# Patient Record
Sex: Female | Born: 1955 | Race: White | Hispanic: No | Smoking: Current every day smoker
Health system: Southern US, Community
[De-identification: ages and names within clinical notes are randomized; demographics above are authoritative.]

## PROBLEM LIST (undated history)

## (undated) DIAGNOSIS — K8020 Calculus of gallbladder without cholecystitis without obstruction: Secondary | ICD-10-CM

## (undated) DIAGNOSIS — E785 Hyperlipidemia, unspecified: Secondary | ICD-10-CM

## (undated) DIAGNOSIS — K219 Gastro-esophageal reflux disease without esophagitis: Secondary | ICD-10-CM

## (undated) DIAGNOSIS — E0590 Thyrotoxicosis, unspecified without thyrotoxic crisis or storm: Secondary | ICD-10-CM

## (undated) DIAGNOSIS — N200 Calculus of kidney: Secondary | ICD-10-CM

## (undated) DIAGNOSIS — G4730 Sleep apnea, unspecified: Secondary | ICD-10-CM

## (undated) HISTORY — DX: Calculus of kidney: N20.0

## (undated) HISTORY — DX: Calculus of gallbladder without cholecystitis without obstruction: K80.20

## (undated) HISTORY — DX: Gastro-esophageal reflux disease without esophagitis: K21.9

## (undated) HISTORY — PX: NO PAST SURGERIES: SHX2092

## (undated) HISTORY — DX: Sleep apnea, unspecified: G47.30

## (undated) HISTORY — PX: MOUTH SURGERY: SHX715

## (undated) HISTORY — DX: Thyrotoxicosis, unspecified without thyrotoxic crisis or storm: E05.90

## (undated) HISTORY — PX: OTHER SURGICAL HISTORY: SHX169

## (undated) HISTORY — DX: Hyperlipidemia, unspecified: E78.5

## (undated) HISTORY — DX: Hemochromatosis, unspecified: E83.119

---

## 2003-02-26 DIAGNOSIS — E0590 Thyrotoxicosis, unspecified without thyrotoxic crisis or storm: Secondary | ICD-10-CM

## 2003-02-26 HISTORY — DX: Thyrotoxicosis, unspecified without thyrotoxic crisis or storm: E05.90

## 2008-02-26 HISTORY — PX: BREAST BIOPSY: SHX20

## 2011-05-21 ENCOUNTER — Ambulatory Visit (HOSPITAL_BASED_OUTPATIENT_CLINIC_OR_DEPARTMENT_OTHER): Payer: 59 | Admitting: Hematology & Oncology

## 2011-05-21 ENCOUNTER — Other Ambulatory Visit (HOSPITAL_BASED_OUTPATIENT_CLINIC_OR_DEPARTMENT_OTHER): Payer: 59 | Admitting: Lab

## 2011-05-21 ENCOUNTER — Ambulatory Visit: Payer: 59

## 2011-05-21 DIAGNOSIS — Z832 Family history of diseases of the blood and blood-forming organs and certain disorders involving the immune mechanism: Secondary | ICD-10-CM

## 2011-05-21 LAB — CBC WITH DIFFERENTIAL (CANCER CENTER ONLY)
BASO%: 0.9 % (ref 0.0–2.0)
HCT: 39.6 % (ref 34.8–46.6)
LYMPH%: 31.6 % (ref 14.0–48.0)
MCHC: 33.1 g/dL (ref 32.0–36.0)
MCV: 82 fL (ref 81–101)
MONO#: 0.5 10*3/uL (ref 0.1–0.9)
NEUT%: 59 % (ref 39.6–80.0)
RDW: 17.1 % — ABNORMAL HIGH (ref 11.1–15.7)

## 2011-05-21 NOTE — Progress Notes (Signed)
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  HISTORY OF PRESENT ILLNESS:  Diana Armstrong is a very charming 56 year old white female.  She actually is the sister of one of my other patients. There is an incredibly strong family history of hemochromatosis in the family.  I have taken care of the 3 or 4 of her family members.  Her father died unexpectedly but not from hemochromatosis.  We do have her sister on a phlebotomy program.  Ms. Carneiro does not live in town here.  She recently was in [place]. She has moved all over the country.  She and I certainly have a lot in common as we both have lived in [place] and [place].  She did have her DNA studies done for hemochromatosis back in 2010.  She had this done down in [place].  She was found to have 2 mutations of the C282Y mutation.  Again, this is what is in her family.  This is incredibly strong gene.  She had been on a phlebotomy program.  The last ferritin that I have on her was back in September 2012 and it was 91.  She said she was phlebotomized in October.  She did have iron studies done back in September 2012.  Iron saturation was only 6%.  She had an ultrasound done of the abdomen back in January 2011. Ultrasound was pretty unremarkable.  She has not had any kind of cardiac issues.  She has had no cough or shortness of breath.  Her last chest x-ray was done back in January 2011.  Chest x-ray shows some interstitial markings that were increased.  These appear to show underlying COPD.  She does smoke.  She apparently underwent a workup for polycythemia which I suspect was unremarkable.  [place]  Her sister was very kind in making a referral to the [facility] for evaluation.  She feels okay.  She is still working.  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  She has not had any weight loss or weight gain.  Appetite is good.   She has had no nausea or vomiting.  There have been no arthritic issues, particularly with her hands.  She has had no rashes.  She has had no nausea, vomiting.  PAST MEDICAL HISTORY: 1. Hemochromatosis. 2. Hypothyroidism. 3. GERD. 4. Hyperlipidemia.  ALLERGIES:  None.  MEDICATIONS:  Aspirin 81 mg p.o. daily, Nexium 40 mg p.o. daily, fenofibrate 54 mg p.o. daily, Synthroid 0.137 mg p.o. daily, Zocor 20 mg p.o. daily.  SOCIAL HISTORY:  Remarkable for 1 pack per day of tobacco use.  She has rare alcohol use.  She has no obvious occupational exposures.  FAMILY HISTORY:  Remarkable for hemochromatosis in her family.  She has, I think, 4 siblings who have it.  She has 2 daughters.  I suspect that each of these has to be a carrier.  REVIEW OF SYSTEMS:  As stated in history of present illness.  No additional findings are noted on a 12-system review.  PHYSICAL EXAM:  General:  This is a well-developed, well-nourished white female in no obvious distress.  Vital signs:  Temperature of 97.8, pulse is 70, respiratory rate 18, blood pressure 123/84, weight is 149.  Head and neck:  Exam shows a normocephalic, atraumatic skull.  There are no ocular or oral lesions.  There is no scleral icterus.  There is no adenopathy in her neck.  Thyroid is  nonpalpable.  Lungs:  Clear bilaterally.  Cardiac:  Regular rate and rhythm with a normal S1, S2. There are no murmurs, rubs, or bruits.  Abdomen:  Soft with good bowel sounds.  There is no palpable abdominal mass.  There is no fluid wave. No hepatosplenomegaly.  Back:  No tenderness over the spine, ribs, or hips.  Extremities:  Shows no clubbing, cyanosis or edema.  She has good range of motion of her joints.  There is no swelling of the 1st MCP joint.  Neurological:  Exam shows no focal neurological deficits.  LABORATORY STUDIES:  White cell count is 7.4, hemoglobin 13.1, hematocrit 39.6, platelet count 457.  MCV is 82.  IMPRESSION:  Ms. Diana Armstrong  is a very nice 56 year old white female with hemochromatosis.  Again, this should be no surprise given her strong family history of hemochromatosis.  We are awaiting her iron studies.  Will see what they are.  Her MCV is only 82.  However, one would have to think that her ferritin is over 100.  If so, we will phlebotomize her.  I am also checking her liver out.  I am sending off an alpha fetoprotein.  I want to make sure that we are not overlooking anything that would be suggestive of hepatocellular carcinoma as this is a risk factor with hemochromatosis.  I will need to set up an appointment for her in the future.  We will see what her iron studies are first.  Once we have these back, then we can plan for additional followups.  We likely will have her come back every 2 months or so for blood work.  I likely will see her back in 4 months or so.  I spent a good hour or more with Ms. Alkins.  Again, she is very intelligent.  She is well-versed on her condition.    ______________________________ Josph Macho, M.D. PRE/MEDQ  D:  05/21/2011  T:  05/21/2011  Job:  [id number]

## 2011-05-22 ENCOUNTER — Telehealth: Payer: Self-pay | Admitting: Hematology & Oncology

## 2011-05-22 ENCOUNTER — Other Ambulatory Visit: Payer: Self-pay | Admitting: Hematology & Oncology

## 2011-05-22 NOTE — Telephone Encounter (Signed)
Mailed 08-01-11 schedule

## 2011-05-24 ENCOUNTER — Telehealth: Payer: Self-pay | Admitting: *Deleted

## 2011-05-24 NOTE — Telephone Encounter (Addendum)
Message copied by Mirian Capuchin on Fri May 24, 2011  8:59 AM ------      Message from: Arlan Organ R      Created: Wed May 22, 2011  8:26 AM       Call:  Ferritin is great !!  It is only 8 - so NO phlebotomy!!!!  Will set up f/u appt for 3 months!!  Pt given this message and voiced understanding.

## 2011-05-25 LAB — IRON AND TIBC: TIBC: 357 ug/dL (ref 250–470)

## 2011-05-25 LAB — COMPREHENSIVE METABOLIC PANEL
ALT: 12 U/L (ref 0–35)
AST: 15 U/L (ref 0–37)
Albumin: 4.4 g/dL (ref 3.5–5.2)
BUN: 16 mg/dL (ref 6–23)
Calcium: 10.5 mg/dL (ref 8.4–10.5)
Chloride: 104 mEq/L (ref 96–112)
Potassium: 4.6 mEq/L (ref 3.5–5.3)
Total Protein: 6.6 g/dL (ref 6.0–8.3)

## 2011-05-29 ENCOUNTER — Other Ambulatory Visit: Payer: Self-pay | Admitting: Hematology & Oncology

## 2011-07-23 ENCOUNTER — Telehealth: Payer: Self-pay | Admitting: Hematology & Oncology

## 2011-07-23 NOTE — Telephone Encounter (Signed)
Pt moved 6-4 to 6-12

## 2011-07-30 ENCOUNTER — Other Ambulatory Visit: Payer: 59 | Admitting: Lab

## 2011-07-30 ENCOUNTER — Ambulatory Visit: Payer: 59 | Admitting: Hematology & Oncology

## 2011-08-01 ENCOUNTER — Ambulatory Visit: Payer: 59 | Admitting: Hematology & Oncology

## 2011-08-01 ENCOUNTER — Other Ambulatory Visit: Payer: 59 | Admitting: Lab

## 2011-08-06 ENCOUNTER — Ambulatory Visit: Payer: 59 | Admitting: Hematology & Oncology

## 2011-08-06 ENCOUNTER — Other Ambulatory Visit: Payer: 59 | Admitting: Lab

## 2011-08-07 ENCOUNTER — Other Ambulatory Visit (HOSPITAL_BASED_OUTPATIENT_CLINIC_OR_DEPARTMENT_OTHER): Payer: 59 | Admitting: Lab

## 2011-08-07 ENCOUNTER — Ambulatory Visit (HOSPITAL_BASED_OUTPATIENT_CLINIC_OR_DEPARTMENT_OTHER): Payer: 59 | Admitting: Hematology & Oncology

## 2011-08-07 LAB — IRON AND TIBC
%SAT: 7 % — ABNORMAL LOW (ref 20–55)
Iron: 28 ug/dL — ABNORMAL LOW (ref 42–145)
UIBC: 371 ug/dL (ref 125–400)

## 2011-08-07 LAB — CBC WITH DIFFERENTIAL (CANCER CENTER ONLY)
BASO#: 0.1 10*3/uL (ref 0.0–0.2)
EOS%: 1.1 % (ref 0.0–7.0)
Eosinophils Absolute: 0.1 10*3/uL (ref 0.0–0.5)
HGB: 13.6 g/dL (ref 11.6–15.9)
LYMPH#: 2.3 10*3/uL (ref 0.9–3.3)
NEUT#: 5.8 10*3/uL (ref 1.5–6.5)
Platelets: 395 10*3/uL (ref 145–400)
RBC: 4.69 10*6/uL (ref 3.70–5.32)

## 2011-08-07 LAB — FERRITIN: Ferritin: 8 ng/mL — ABNORMAL LOW (ref 10–291)

## 2011-08-07 NOTE — Progress Notes (Signed)
This office note has been dictated.

## 2011-08-08 NOTE — Progress Notes (Signed)
DIAGNOSIS:  Hemochromatosis (C282Y mutation, homozygous).  CURRENT THERAPY:  Phlebotomy to maintain ferritin less than 100.  INTERIM HISTORY:  Diana Armstrong comes in for her 2nd office visit.  We first saw her back in March.  At that point time, her ferritin was only 8.  Her iron saturation was 19.  She feels well.  She is working without difficulties.  She has had no fatigue.  She is having no cough or shortness of breath.  There are no arthritic issues.  PHYSICAL EXAMINATION:  This is a well-developed, well-nourished white female in no obvious distress.  Vital signs:  98.5, pulse 57, respiratory rate 20, blood pressure 138/81.  Weight is 152.  Head and neck:  Normocephalic, atraumatic skull.  There are no ocular or oral lesions.  There are no palpable cervical or supraclavicular lymph nodes. Lungs:  Clear bilaterally.  Cardiac:  Regular rate and rhythm with a normal S1 and S2.  There are no murmurs, rubs or bruits.  Abdomen:  Soft with good bowel sounds.  There is no palpable abdominal mass.  There is no palpable hepatosplenomegaly. Back:  No tenderness over the spine, ribs, or hips.  Extremities:  No clubbing, cyanosis or edema. Neurologic:  No focal neurological deficits.  Skin:  Does show some erythema on the palms of her hands.  LABORATORY STUDIES:  White cell count 8.8, hemoglobin 13.6, hematocrit 40.8, platelet count 395.  MCV is 87.  IMPRESSION:  Diana Armstrong is a charming 56 year old white female with hemochromatosis.  She is homozygous for the main mutation.  I am surprised that her iron is as low as it is.  This hopefully will maintain itself.  We could probably check her every 6 months from my point of view.  I will plan to get her back in 6 months' time now.  I do not think we need any blood work in between visits.    ______________________________ Josph Macho, M.D. PRE/MEDQ  D:  08/07/2011  T:  08/08/2011  Job:  [id number]

## 2011-08-13 ENCOUNTER — Telehealth: Payer: Self-pay | Admitting: *Deleted

## 2011-08-13 NOTE — Telephone Encounter (Addendum)
Message copied by Wynonia Hazard on Tue Aug 13, 2011 11:17 AM ------      Message from: Arlan Organ R      Created: Wed Aug 07, 2011  6:52 PM       Please call and let her node that her ferritin is still nice and low. She is not need a phlebotomy. Her ferritin is 8. Pete  08/13/11 - Spoke to pt. Gave her the above message with verbalized understanding.

## 2011-12-04 ENCOUNTER — Other Ambulatory Visit (HOSPITAL_BASED_OUTPATIENT_CLINIC_OR_DEPARTMENT_OTHER): Payer: BC Managed Care – PPO | Admitting: Lab

## 2011-12-04 ENCOUNTER — Ambulatory Visit (HOSPITAL_BASED_OUTPATIENT_CLINIC_OR_DEPARTMENT_OTHER): Payer: BC Managed Care – PPO | Admitting: Hematology & Oncology

## 2011-12-04 LAB — CBC WITH DIFFERENTIAL (CANCER CENTER ONLY)
BASO%: 0.7 % (ref 0.0–2.0)
EOS%: 0.9 % (ref 0.0–7.0)
HCT: 42.3 % (ref 34.8–46.6)
LYMPH%: 27.5 % (ref 14.0–48.0)
MCHC: 34.5 g/dL (ref 32.0–36.0)
MCV: 91 fL (ref 81–101)
MONO#: 0.7 10*3/uL (ref 0.1–0.9)
NEUT%: 64.6 % (ref 39.6–80.0)
Platelets: 380 10*3/uL (ref 145–400)
RDW: 14.1 % (ref 11.1–15.7)
WBC: 11 10*3/uL — ABNORMAL HIGH (ref 3.9–10.0)

## 2011-12-04 LAB — IRON AND TIBC
%SAT: 50 % (ref 20–55)
Iron: 170 ug/dL — ABNORMAL HIGH (ref 42–145)

## 2011-12-04 NOTE — Progress Notes (Signed)
This office note has been dictated.

## 2011-12-04 NOTE — Patient Instructions (Signed)
Call with problems.

## 2011-12-05 ENCOUNTER — Telehealth: Payer: Self-pay | Admitting: Hematology & Oncology

## 2011-12-05 NOTE — Progress Notes (Signed)
CC:   Kari Baars, M.D.  DIAGNOSIS:  Hemochromatosis (C282Y homozygous mutation).  CURRENT THERAPY:  Phlebotomy to maintain ferritin less than 100.  INTERIM HISTORY:  Ms. Nordhoff comes in for followup.  We saw her back in June.  She is doing well.  Back then, her ferritin was only 8.  She has done nicely.  She has had no problems since we last saw her. She is working without any difficulties.  She is having no abdominal pain.  There are  no joint issues.  There is no change in bowel or bladder habits.  She has had no cough.  There are no fevers, sweats or chills.  PHYSICAL EXAMINATION:  This is a well-developed, well-nourished white female in no obvious distress.  Vital signs:  Temperature of 97.7, pulse 82, respiratory rate 16, blood pressure 129/75.  Weight is 151.  Head and neck:  Normocephalic, atraumatic skull.  There are no ocular or oral lesions.  There are no palpable cervical or supraclavicular lymph nodes. Lungs:  Clear bilaterally.  Cardiac:  Regular rate and rhythm with a normal S1 and S2.  There are no murmurs, rubs or bruits.  Abdomen:  Soft with good bowel sounds.  There is no fluid wave.  There is no palpable hepatosplenomegaly:  No tenderness of the spine, ribs, or hips. Extremities:  No clubbing, cyanosis or edema.  Skin:  No rashes, ecchymosis or petechia.  LABORATORY STUDIES:  White cell count is 11, hemoglobin 14.6, hematocrit 42.3, platelet count 380.  Ferritin is 23.  IMPRESSION:  Ms. Manges is a very nice 56 year old female with hemochromatosis.  She is homozygous for the major mutation, C282Y.  I still do not see a need for phlebotomy.  She is asymptomatic.  Her iron saturation is up a little bit so we may have to watch out with this.  We will plan to get her back in another 4 months so.    ______________________________ Josph Macho, M.D. PRE/MEDQ  D:  12/04/2011  T:  12/05/2011  Job:  [id number]

## 2011-12-05 NOTE — Telephone Encounter (Signed)
Mailed February schedule °

## 2012-04-06 ENCOUNTER — Other Ambulatory Visit (HOSPITAL_BASED_OUTPATIENT_CLINIC_OR_DEPARTMENT_OTHER): Payer: BC Managed Care – PPO | Admitting: Lab

## 2012-04-06 ENCOUNTER — Ambulatory Visit (HOSPITAL_BASED_OUTPATIENT_CLINIC_OR_DEPARTMENT_OTHER): Payer: BC Managed Care – PPO | Admitting: Hematology & Oncology

## 2012-04-06 LAB — COMPREHENSIVE METABOLIC PANEL
CO2: 24 mEq/L (ref 19–32)
Calcium: 10.4 mg/dL (ref 8.4–10.5)
Chloride: 106 mEq/L (ref 96–112)
Creatinine, Ser: 1.02 mg/dL (ref 0.50–1.10)
Glucose, Bld: 104 mg/dL — ABNORMAL HIGH (ref 70–99)
Total Bilirubin: 0.4 mg/dL (ref 0.3–1.2)
Total Protein: 6.8 g/dL (ref 6.0–8.3)

## 2012-04-06 LAB — CBC WITH DIFFERENTIAL (CANCER CENTER ONLY)
BASO%: 0.5 % (ref 0.0–2.0)
EOS%: 0.8 % (ref 0.0–7.0)
HGB: 14.6 g/dL (ref 11.6–15.9)
LYMPH#: 2 10*3/uL (ref 0.9–3.3)
MCHC: 33.8 g/dL (ref 32.0–36.0)
MONO%: 4.7 % (ref 0.0–13.0)
NEUT#: 6.3 10*3/uL (ref 1.5–6.5)
Platelets: 340 10*3/uL (ref 145–400)
RDW: 13.1 % (ref 11.1–15.7)

## 2012-04-06 LAB — IRON AND TIBC
TIBC: 287 ug/dL (ref 250–470)
UIBC: 146 ug/dL (ref 125–400)

## 2012-04-06 NOTE — Progress Notes (Signed)
This office note has been dictated.

## 2012-04-07 NOTE — Progress Notes (Signed)
CC:   Diana Armstrong, M.D.  DIAGNOSIS:  Hemochromatosis (C282Y mutation-homozygous).  CURRENT THERAPY:  Phlebotomy to maintain ferritin less than 100.  INTERIM HISTORY:  Diana Armstrong comes in for followup.  We see her every 4 months.  She is doing quite well.  She has not been phlebotomized probably for about a year and a half.  Her last ferritin that we got on her back in October I think was 23.  She has had no problems with bony pain.  She has had no fatigue or weakness.  There has been no change in bowel or bladder habits.  She has had no nausea or vomiting.  There has been no rash.  There has been no leg swelling.  PHYSICAL EXAMINATION:  General:  This is a well-developed, well- nourished white female in no obvious distress.  Vital signs: Temperature of 98.1, pulse 75, respiratory rate 16, blood pressure 138/58.  Weight is 152.  Head and neck:  Normocephalic, atraumatic skull.  There are no ocular or oral lesions.  There are no palpable cervical or supraclavicular lymph nodes.  Lungs:  Clear bilaterally. Cardiac:  Regular rate and rhythm with a normal S1 and S2.  There are no murmurs, rubs, or bruits.  Abdomen:  Soft with good bowel sounds.  There is no palpable abdominal mass.  There is no fluid wave.  No palpable hepatosplenomegaly is noted.  Back:  No tenderness over the spine, ribs, or hips.  Extremities:  No clubbing, cyanosis, or edema.  Neurological: No focal neurological deficits.  LABORATORY STUDIES:  White cell count 8.7, hemoglobin 14.6, hematocrit 43.2, platelet count is 340.  MCV is 96.  Iron is 141.  Iron saturation is 49%.  Ferritin is pending.  IMPRESSION:  Diana Armstrong is a very nice 57 year old white female with hemochromatosis.  She has done incredibly well.  We will see what her ferritin is.  I would be surprised if she does need to be phlebotomized.  We will plan to get her back in 6 months at this point in time.  I think 42-month followup would be  appropriate.    ______________________________ Josph Macho, M.D. PRE/MEDQ  D:  04/06/2012  T:  04/07/2012  Job:  [id number]

## 2012-04-08 ENCOUNTER — Ambulatory Visit: Payer: BC Managed Care – PPO | Admitting: Hematology & Oncology

## 2012-04-08 ENCOUNTER — Other Ambulatory Visit: Payer: BC Managed Care – PPO | Admitting: Lab

## 2012-04-14 ENCOUNTER — Other Ambulatory Visit: Payer: Self-pay | Admitting: Hematology & Oncology

## 2012-04-15 ENCOUNTER — Telehealth: Payer: Self-pay | Admitting: Hematology & Oncology

## 2012-04-15 NOTE — Telephone Encounter (Signed)
Pt aware of 2-26 [place] and to be NPO 6 hrs

## 2012-04-21 ENCOUNTER — Other Ambulatory Visit (HOSPITAL_COMMUNITY): Payer: BC Managed Care – PPO

## 2012-04-22 ENCOUNTER — Other Ambulatory Visit: Payer: Self-pay | Admitting: Hematology & Oncology

## 2012-04-22 ENCOUNTER — Ambulatory Visit (HOSPITAL_COMMUNITY)
Admission: RE | Admit: 2012-04-22 | Discharge: 2012-04-22 | Disposition: A | Discharge status: Home / Self Care | Payer: BC Managed Care – PPO | Source: Ambulatory Visit | Attending: Hematology & Oncology | Admitting: Hematology & Oncology

## 2012-04-22 DIAGNOSIS — K8020 Calculus of gallbladder without cholecystitis without obstruction: Secondary | ICD-10-CM | POA: Insufficient documentation

## 2012-04-22 DIAGNOSIS — K7689 Other specified diseases of liver: Secondary | ICD-10-CM | POA: Insufficient documentation

## 2012-04-22 DIAGNOSIS — R978 Other abnormal tumor markers: Secondary | ICD-10-CM | POA: Insufficient documentation

## 2012-04-24 ENCOUNTER — Telehealth: Payer: Self-pay | Admitting: *Deleted

## 2012-04-24 NOTE — Telephone Encounter (Signed)
Message copied by Anselm Jungling on Fri Apr 24, 2012  1:27 PM ------      Message from: Arlan Organ R      Created: Wed Apr 22, 2012  9:18 PM       Call - liver looks ok!!  You do have gallstones, but nothing to do about right now!!!  Pete ------

## 2012-04-24 NOTE — Telephone Encounter (Signed)
Message copied by Edwardo Wojnarowski ELLEN O on Fri Apr 24, 2012  1:27 PM ------      Message from: ENNEVER, PETER R      Created: Wed Apr 22, 2012  9:18 PM       Call - liver looks ok!!  You do have gallstones, but nothing to do about right now!!!  Pete ------ 

## 2012-04-24 NOTE — Telephone Encounter (Signed)
Called patient to let her know that her liver on ultrasound looks ok.  Told her about the gallstones but doesn't need to do anything about that right now

## 2012-07-27 ENCOUNTER — Ambulatory Visit (HOSPITAL_COMMUNITY)
Admission: RE | Admit: 2012-07-27 | Discharge: 2012-07-27 | Disposition: A | Discharge status: Home / Self Care | Payer: BC Managed Care – PPO | Source: Ambulatory Visit | Attending: Internal Medicine | Admitting: Internal Medicine

## 2012-07-27 ENCOUNTER — Encounter (HOSPITAL_COMMUNITY): Payer: Self-pay

## 2012-07-27 ENCOUNTER — Other Ambulatory Visit (HOSPITAL_COMMUNITY): Payer: Self-pay | Admitting: Internal Medicine

## 2012-07-27 DIAGNOSIS — M810 Age-related osteoporosis without current pathological fracture: Secondary | ICD-10-CM | POA: Insufficient documentation

## 2012-07-27 MED ORDER — ZOLEDRONIC ACID 5 MG/100ML IV SOLN
5.0000 mg | Freq: Once | INTRAVENOUS | Status: AC
  Administered 2012-07-27: 5 mg via INTRAVENOUS
  Filled 2012-07-27: qty 100

## 2012-07-27 MED ORDER — SODIUM CHLORIDE 0.9 % IV SOLN
Freq: Once | INTRAVENOUS | Status: AC
  Administered 2012-07-27: 20 mL/h via INTRAVENOUS

## 2012-07-30 ENCOUNTER — Encounter (HOSPITAL_COMMUNITY): Payer: BC Managed Care – PPO

## 2012-07-31 ENCOUNTER — Encounter (HOSPITAL_COMMUNITY): Payer: BC Managed Care – PPO

## 2012-10-01 ENCOUNTER — Telehealth: Payer: Self-pay | Admitting: Hematology & Oncology

## 2012-10-01 NOTE — Telephone Encounter (Signed)
Patient called and cx 10/05/12 apt.  She stated she will call back to resch

## 2012-10-05 ENCOUNTER — Other Ambulatory Visit: Payer: BC Managed Care – PPO | Admitting: Lab

## 2012-10-05 ENCOUNTER — Ambulatory Visit: Payer: BC Managed Care – PPO | Admitting: Hematology & Oncology

## 2013-02-16 ENCOUNTER — Encounter: Payer: Self-pay | Admitting: Nurse Practitioner

## 2013-02-22 ENCOUNTER — Encounter: Payer: Self-pay | Admitting: Nurse Practitioner

## 2013-02-22 ENCOUNTER — Ambulatory Visit (INDEPENDENT_AMBULATORY_CARE_PROVIDER_SITE_OTHER): Payer: BC Managed Care – PPO | Admitting: Nurse Practitioner

## 2013-02-22 VITALS — BP 100/64 | HR 64 | Ht 64.25 in | Wt 152.0 lb

## 2013-02-22 DIAGNOSIS — Z0000 Encounter for general adult medical examination without abnormal findings: Secondary | ICD-10-CM

## 2013-02-22 DIAGNOSIS — Z01419 Encounter for gynecological examination (general) (routine) without abnormal findings: Secondary | ICD-10-CM

## 2013-02-22 LAB — POCT URINALYSIS DIPSTICK
Bilirubin, UA: NEGATIVE
Blood, UA: NEGATIVE
Glucose, UA: NEGATIVE
Ketones, UA: NEGATIVE
Leukocytes, UA: NEGATIVE
Protein, UA: NEGATIVE
Urobilinogen, UA: NEGATIVE

## 2013-02-22 NOTE — Patient Instructions (Signed)

## 2013-02-22 NOTE — Progress Notes (Signed)
Patient ID: Diana Armstrong, female   DOB: Mar 23, 1955, 57 y.o.   MRN: [id number] 57 y.o. [id number] Divorced Caucasian Fe here for annual exam.  Some vaso symptoms.  She thought TSH was off due to increase in heat intolerance.  Since then has had change of Synthroid dosage.  Not sexually active or dating.  No LMP recorded. Patient is postmenopausal.          Sexually active: no  The current method of family planning is none.    Exercising: yes.  Gym/ health club routine includes cardio and light weights. Smoker:  yes  Health Maintenance: Pap:  02/11/12, WNL, neg HR HPV MMG:  02/2011, normal Colonoscopy:  2005, normal, repeat in 5 years BMD:  07/2012, osteoporosis of spine  Reclast IV at PCP TDaP:  2011 Labs:  HB: PCP Urine: negative   reports that she has been smoking Cigarettes.  She has a 18 pack-year smoking history. She uses smokeless tobacco. She reports that she drinks about 1.8 ounces of alcohol per week.  Past Medical History  Diagnosis Date  . 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 Hemochromatosis     History reviewed. No pertinent past surgical history.  Current Outpatient Prescriptions  Medication Sig Dispense Refill  . aspirin 81 MG tablet Take 81 mg by mouth every other day.      . calcium-vitamin D (OSCAL) 250-125 MG-UNIT per tablet Take 1 tablet by mouth daily.      Marland Kitchen esomeprazole (NEXIUM) 40 MG capsule Take 40 mg by mouth daily before breakfast.      . fenofibrate 54 MG tablet Take 54 mg by mouth daily.      . fish oil-omega-3 fatty acids 1000 MG capsule Take 1,200 mg by mouth daily.      Marland Kitchen levothyroxine (SYNTHROID, LEVOTHROID) 112 MCG tablet Take 112 mcg by mouth daily before breakfast.      . simvastatin (ZOCOR) 20 MG tablet Take 20 mg by mouth every evening.       No current facility-administered medications for this visit.    Family History  Problem Relation Age of Onset  . Hemochromatosis Mother   . Hypertension Mother   . Thyroid disease  Mother     hypo  . Hemochromatosis Father   . Hemochromatosis Brother   . Hemochromatosis Brother   . Thyroid disease Daughter     hypo    ROS:  Pertinent items are noted in HPI.  Otherwise, a comprehensive ROS was negative.  Exam:   BP 100/64  Pulse 64  Ht 5' 4.25" (1.632 m)  Wt 152 lb (68.947 kg)  BMI 25.89 kg/m2 Height: 5' 4.25" (163.2 cm)  Ht Readings from Last 3 Encounters:  02/22/13 5' 4.25" (1.632 m)  07/27/12 5\' 4"  (1.626 m)  04/06/12 5\' 4"  (1.626 m)    General appearance: alert, cooperative and appears stated age Head: Normocephalic, without obvious abnormality, atraumatic Neck: no adenopathy, supple, symmetrical, trachea midline and thyroid normal to inspection and palpation Lungs: clear to auscultation bilaterally Breasts: normal appearance, no masses or tenderness Heart: regular rate and rhythm Abdomen: soft, non-tender; no masses,  no organomegaly Extremities: extremities normal, atraumatic, no cyanosis or edema Skin: Skin color, texture, turgor normal. No rashes or lesions Lymph nodes: Cervical, supraclavicular, and axillary nodes normal. No abnormal inguinal nodes palpated Neurologic: Grossly normal   Pelvic: External genitalia:  no lesions              Urethra:  normal appearing urethra with no masses, tenderness or lesions              Bartholin's and Skene's: normal                 Vagina: normal appearing vagina with normal color and discharge, no lesions              Cervix: anteverted              Pap taken: no Bimanual Exam:  Uterus:  normal size, contour, position, consistency, mobility, non-tender              Adnexa: no mass, fullness, tenderness               Rectovaginal: Confirms               Anus:  normal sphincter tone, no lesions  A:  Well Woman with normal exam  Postmenopausal no HRT  Hypothyroid on replacement  Osteoporosis of spine - Reclast IV 07/2012 - managed by PCP  P:   Pap smear as per guidelines   Mammogram is  scheduled  Will schedule colonoscopy for 2015  Counseled on breast self exam, mammography screening, adequate intake of calcium and vitamin D, diet and exercise, Kegel's exercises return annually or prn  An After Visit Summary was printed and given to the patient.

## 2013-02-23 ENCOUNTER — Other Ambulatory Visit: Payer: Self-pay

## 2013-02-23 DIAGNOSIS — Z1231 Encounter for screening mammogram for malignant neoplasm of breast: Secondary | ICD-10-CM

## 2013-02-23 NOTE — Progress Notes (Signed)
Encounter reviewed by Dr. Leyan Branden Silva.  

## 2013-02-24 ENCOUNTER — Ambulatory Visit
Admission: RE | Admit: 2013-02-24 | Discharge: 2013-02-24 | Disposition: A | Discharge status: Home / Self Care | Payer: BC Managed Care – PPO | Source: Ambulatory Visit

## 2013-02-24 DIAGNOSIS — Z1231 Encounter for screening mammogram for malignant neoplasm of breast: Secondary | ICD-10-CM

## 2013-03-01 ENCOUNTER — Ambulatory Visit: Payer: Self-pay | Admitting: Nurse Practitioner

## 2013-04-20 LAB — IFOBT (OCCULT BLOOD): IFOBT: NEGATIVE

## 2013-04-29 ENCOUNTER — Ambulatory Visit: Payer: Self-pay | Admitting: Nurse Practitioner

## 2013-08-06 ENCOUNTER — Other Ambulatory Visit (HOSPITAL_COMMUNITY): Payer: Self-pay | Admitting: Internal Medicine

## 2013-08-06 ENCOUNTER — Ambulatory Visit (HOSPITAL_COMMUNITY)
Admission: RE | Admit: 2013-08-06 | Discharge: 2013-08-06 | Disposition: A | Discharge status: Home / Self Care | Payer: PRIVATE HEALTH INSURANCE | Source: Ambulatory Visit | Attending: Internal Medicine | Admitting: Internal Medicine

## 2013-08-06 DIAGNOSIS — M810 Age-related osteoporosis without current pathological fracture: Secondary | ICD-10-CM | POA: Insufficient documentation

## 2013-08-06 MED ORDER — ZOLEDRONIC ACID 5 MG/100ML IV SOLN
5.0000 mg | Freq: Once | INTRAVENOUS | Status: AC
  Administered 2013-08-06: 5 mg via INTRAVENOUS
  Filled 2013-08-06: qty 100

## 2013-08-06 MED ORDER — SODIUM CHLORIDE 0.9 % IV SOLN
Freq: Once | INTRAVENOUS | Status: AC
  Administered 2013-08-06: 250 mL via INTRAVENOUS

## 2013-08-06 NOTE — Discharge Instructions (Signed)
Drink fluids/water as tolerated over next 72hrs °Tylenol or Ibuprofen OTC as directed °Continue calcium and Vit D as directed by your MDZoledronic Acid injection (Paget's Disease, Osteoporosis) °What is this medicine? °ZOLEDRONIC ACID (ZOE le dron ik AS id) lowers the amount of calcium loss from bone. It is used to treat Paget's disease and osteoporosis in women. °This medicine may be used for other purposes; ask your health care provider or pharmacist if you have questions. °COMMON BRAND NAME(S): Reclast, Zometa °What should I tell my health care provider before I take this medicine? °They need to know if you have any of these conditions: °-aspirin-sensitive asthma °-cancer, especially if you are receiving medicines used to treat cancer °-dental disease or wear dentures °-infection °-kidney disease °-low levels of calcium in the blood °-past surgery on the parathyroid gland or intestines °-receiving corticosteroids like dexamethasone or prednisone °-an unusual or allergic reaction to zoledronic acid, other medicines, foods, dyes, or preservatives °-pregnant or trying to get pregnant °-breast-feeding °How should I use this medicine? °This medicine is for infusion into a vein. It is given by a health care professional in a hospital or clinic setting. °Talk to your pediatrician regarding the use of this medicine in children. This medicine is not approved for use in children. °Overdosage: If you think you have taken too much of this medicine contact a poison control center or emergency room at once. °NOTE: This medicine is only for you. Do not share this medicine with others. °What if I miss a dose? °It is important not to miss your dose. Call your doctor or health care professional if you are unable to keep an appointment. °What may interact with this medicine? °-certain antibiotics given by injection °-NSAIDs, medicines for pain and inflammation, like ibuprofen or naproxen °-some diuretics like bumetanide,  furosemide °-teriparatide °This list may not describe all possible interactions. Give your health care provider a list of all the medicines, herbs, non-prescription drugs, or dietary supplements you use. Also tell them if you smoke, drink alcohol, or use illegal drugs. Some items may interact with your medicine. °What should I watch for while using this medicine? °Visit your doctor or health care professional for regular checkups. It may be some time before you see the benefit from this medicine. Do not stop taking your medicine unless your doctor tells you to. Your doctor may order blood tests or other tests to see how you are doing. °Women should inform their doctor if they wish to become pregnant or think they might be pregnant. There is a potential for serious side effects to an unborn child. Talk to your health care professional or pharmacist for more information. °You should make sure that you get enough calcium and vitamin D while you are taking this medicine. Discuss the foods you eat and the vitamins you take with your health care professional. °Some people who take this medicine have severe bone, joint, and/or muscle pain. This medicine may also increase your risk for jaw problems or a broken thigh bone. Tell your doctor right away if you have severe pain in your jaw, bones, joints, or muscles. Tell your doctor if you have any pain that does not go away or that gets worse. °Tell your dentist and dental surgeon that you are taking this medicine. You should not have major dental surgery while on this medicine. See your dentist to have a dental exam and fix any dental problems before starting this medicine. Take good care of your teeth while on   this medicine. Make sure you see your dentist for regular follow-up appointments. What side effects may I notice from receiving this medicine? Side effects that you should report to your doctor or health care professional as soon as possible: -allergic reactions  like skin rash, itching or hives, swelling of the face, lips, or tongue -anxiety, confusion, or depression -breathing problems -changes in vision -eye pain -feeling faint or lightheaded, falls -jaw pain, especially after dental work -mouth sores -muscle cramps, stiffness, or weakness -trouble passing urine or change in the amount of urine Side effects that usually do not require medical attention (report to your doctor or health care professional if they continue or are bothersome): -bone, joint, or muscle pain -constipation -diarrhea -fever -hair loss -irritation at site where injected -loss of appetite -nausea, vomiting -stomach upset -trouble sleeping -trouble swallowing -weak or tired This list may not describe all possible side effects. Call your doctor for medical advice about side effects. You may report side effects to FDA at 1-800-FDA-1088. Where should I keep my medicine? This drug is given in a hospital or clinic and will not be stored at home. NOTE: This sheet is a summary. It may not cover all possible information. If you have questions about this medicine, talk to your doctor, pharmacist, or health care provider.  2014, [employer]. (2012-07-27 10:03:48)

## 2013-12-07 ENCOUNTER — Encounter: Payer: Self-pay | Admitting: Internal Medicine

## 2013-12-27 ENCOUNTER — Encounter: Payer: Self-pay | Admitting: Nurse Practitioner

## 2014-02-08 ENCOUNTER — Ambulatory Visit: Payer: PRIVATE HEALTH INSURANCE | Admitting: Internal Medicine

## 2014-02-25 HISTORY — PX: COLONOSCOPY: SHX174

## 2014-03-03 ENCOUNTER — Ambulatory Visit: Payer: BC Managed Care – PPO | Admitting: Nurse Practitioner

## 2014-03-17 ENCOUNTER — Encounter: Payer: Self-pay | Admitting: *Deleted

## 2014-03-24 ENCOUNTER — Encounter: Payer: Self-pay | Admitting: Internal Medicine

## 2014-03-24 ENCOUNTER — Ambulatory Visit (INDEPENDENT_AMBULATORY_CARE_PROVIDER_SITE_OTHER): Payer: PRIVATE HEALTH INSURANCE | Admitting: Internal Medicine

## 2014-03-24 VITALS — BP 130/72 | HR 56 | Ht 64.0 in | Wt 157.1 lb

## 2014-03-24 DIAGNOSIS — Z1211 Encounter for screening for malignant neoplasm of colon: Secondary | ICD-10-CM

## 2014-03-24 DIAGNOSIS — K219 Gastro-esophageal reflux disease without esophagitis: Secondary | ICD-10-CM

## 2014-03-24 MED ORDER — MOVIPREP 100 G PO SOLR: 1.0000 | Freq: Once | ORAL | Status: DC

## 2014-03-24 NOTE — Patient Instructions (Addendum)
You have been scheduled for a colonoscopy. Please follow written instructions given to you at your visit today.  Please pick up your prep kit at the pharmacy within the next 1-3 days. If you use inhalers (even only as needed), please bring them with you on the day of your procedure. Your physician has requested that you go to www.startemmi.com and enter the access code given to you at your visit today. This web site gives a general overview about your procedure. However, you should still follow specific instructions given to you by our office regarding your preparation for the procedure.  We have contacted you PCP Marton Redwood for your medical records.  Please continue taking esomeprazole over the counter.  GD:JMEQAST Brigitte Pulse

## 2014-03-24 NOTE — Progress Notes (Addendum)
Patient ID: Diana Armstrong, female   DOB: 06/27/1955, 59 y.o.   MRN: [id number] HPI: Diana Armstrong is a 59 yo female with PMH of hemochromatosis requiring phlebotomy, hyperthyroidism status post treatment now on replacement, sleep apnea and hyperlipidemia who is seen in consultation at the request of Dr. Brigitte Pulse to discuss screening colonoscopy. She is here alone today. She reports she is feeling well. Her last colonoscopy was in 2005 performed in [place]. We do not have the formal report but we have her discharge instructions indicating no polyps and internal hemorrhoids. She reports regular bowel movements with no blood in her stool or melena. She denies diarrhea or constipation. No abdominal pain. She was diagnosed with hemochromatosis in [place] when she was complaining of fatigue. Reportedly no history of jaundice, itching, pancreatic insufficiency, joint pains or other liver symptoms. She did require frequent phlebotomy initially due to a ferritin level of a proximally 1250. Phlebotomy now occurs every 2-3 months to maintain her ferritin less than 100. Dr. Brigitte Pulse has been managing this for her. She reports 6 siblings and 3 of her siblings also have hemochromatosis. No history of advanced liver disease or decompensated cirrhosis. She also has a history of reflux disease and takes Nexium 20 mg over-the-counter. She reports good control of heartburn symptoms with this medication and denies dysphagia, odynophagia, breakthrough heartburn, early satiety, nausea or vomiting.  Past Medical History  Diagnosis Date  . 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 Hemochromatosis   . Gallstones   . Sleep apnea   . Hyperlipidemia   . Kidney stones     Past Surgical History  Procedure Laterality Date  . No past surgeries      Outpatient Prescriptions Prior to Visit  Medication Sig Dispense Refill  . aspirin 81 MG tablet Take 81 mg by mouth every other day.    . esomeprazole (NEXIUM)  40 MG capsule Take 40 mg by mouth daily before breakfast.    . fenofibrate 54 MG tablet Take 54 mg by mouth daily.    . fish oil-omega-3 fatty acids 1000 MG capsule Take 1,200 mg by mouth daily.    Marland Kitchen levothyroxine (SYNTHROID, LEVOTHROID) 112 MCG tablet Take 112 mcg by mouth daily before breakfast.    . simvastatin (ZOCOR) 20 MG tablet Take 20 mg by mouth every evening.    . calcium-vitamin D (OSCAL) 250-125 MG-UNIT per tablet Take 1 tablet by mouth daily.     No facility-administered medications prior to visit.    No Known Allergies  Family History  Problem Relation Age of Onset  . Hemochromatosis Mother   . Hypertension Mother   . Thyroid disease Mother     hypo  . Hemochromatosis Father   . Hemochromatosis Brother   . Hemochromatosis Brother   . Thyroid disease Daughter     hypo  . Colon cancer Neg Hx   . Colon polyps Sister   . Colon polyps Brother   . Diabetes Neg Hx   . Esophageal cancer Neg Hx   . Gallbladder disease Neg Hx     History  Substance Use Topics  . Smoking status: Current Some Day Smoker -- 0.50 packs/day for 36 years    Types: Cigarettes  . Smokeless tobacco: Never Used  . Alcohol Use: 1.8 oz/week    3 Cans of beer per week     Comment: On weekends    ROS: As per history of present illness, otherwise negative  BP 130/72 mmHg  Pulse 56  Ht 5\' 4"  (1.626 m)  Wt 157 lb 2 oz (71.271 kg)  BMI 26.96 kg/m2 Constitutional: Well-developed and well-nourished. No distress. HEENT: Normocephalic and atraumatic. Oropharynx is clear and moist. No oropharyngeal exudate. Conjunctivae are normal.  No scleral icterus. Neck: Neck supple. Trachea midline. Cardiovascular: Normal rate, regular rhythm and intact distal pulses. No M/R/G Pulmonary/chest: Effort normal and breath sounds normal. No wheezing, rales or rhonchi. Abdominal: Soft, nontender, nondistended. Bowel sounds active throughout. There are no masses palpable. No hepatosplenomegaly. Extremities: no  clubbing, cyanosis, or edema Lymphadenopathy: No cervical adenopathy noted. Neurological: Alert and oriented to person place and time. Skin: Skin is warm and dry. No rashes noted. Psychiatric: Normal mood and affect. Behavior is normal.  RELEVANT LABS (remote) CBC    Component Value Date/Time   WBC 8.7 04/06/2012 0914   RBC 4.52 04/06/2012 0914   HGB 14.6 04/06/2012 0914   HCT 43.2 04/06/2012 0914   PLT 340 04/06/2012 0914   MCV 96 04/06/2012 0914   MCH 32.3 04/06/2012 0914   MCHC 33.8 04/06/2012 0914   RDW 13.1 04/06/2012 0914   LYMPHSABS 2.0 04/06/2012 0914   EOSABS 0.1 04/06/2012 0914   BASOSABS 0.0 04/06/2012 0914    CMP     Component Value Date/Time   NA 137 04/06/2012 0914   K 4.4 04/06/2012 0914   CL 106 04/06/2012 0914   CO2 24 04/06/2012 0914   GLUCOSE 104* 04/06/2012 0914   BUN 19 04/06/2012 0914   CREATININE 1.02 04/06/2012 0914   CALCIUM 10.4 04/06/2012 0914   PROT 6.8 04/06/2012 0914   ALBUMIN 4.4 04/06/2012 0914   AST 17 04/06/2012 0914   ALT 18 04/06/2012 0914   ALKPHOS 67 04/06/2012 0914   BILITOT 0.4 04/06/2012 0914    ASSESSMENT/PLAN: 59 yo female with PMH of hemochromatosis requiring phlebotomy, hyperthyroidism status post treatment now on replacement, sleep apnea and hyperlipidemia who is seen in consultation at the request of Dr. Brigitte Pulse to discuss screening colonoscopy.   1. CRC screening -- last colonoscopy in November 2005. She is due for repeat screening colonoscopy. We discussed the test including the risks and benefits and she is agreeable to proceed.  2. Hemochromatosis -- CBC, liver enzymes and ferritin levels monitored by primary care. I have requested these records and she has upcoming labs next month prior to a physical with Dr. Brigitte Pulse. I have also requested these labs be shared with me. Phlebotomy as needed to maintain ferritin levels between 50 and 100 ng/mL. No evidence for advanced liver disease.  3. GERD -- well-controlled on Nexium 20  mg daily without alarm symptoms. Continue Nexium 20 mg daily.    Of note: I have attempted on multiple occasions to obtain colonoscopy report from 2005 which patient states was done at [facility] on [address] in [place], [place]. I faxed ROI to [phone] on 03/24/14 @ 9:03 am and again faxed ROI to [phone] on 04/06/14 @ 9:35 am after speaking with a staff member at [facility] who told me she could not find ROI. I have again contacted [facility] and they tell me that they are unable to locate a patient with our patient's name and date of birth in their system. Dixon Boos, CMA 04/25/14 3:05 pm

## 2014-03-25 ENCOUNTER — Telehealth: Payer: Self-pay | Admitting: *Deleted

## 2014-03-25 NOTE — Telephone Encounter (Signed)
I have left a message for patient to call back. Dr Hilarie Fredrickson has received a copy of patient's labwork completed by Dr Marton Redwood on 07/14/13. Patient's ferritin at that time was 142.4. Per Dr Hilarie Fredrickson, "ferritin should be rechecked before upcoming physical, target range for phlebotomy 50-100. If still above 100, would phlebotomize. We would like [place] to tell patient this.

## 2014-03-25 NOTE — Telephone Encounter (Signed)
I have spoken to Diana Armstrong and have relayed Dr Vena Rua recommendations for her. She states that she was actually phlebotomized after the 07/14/13 labs and is already scheduled for recheck in late February.

## 2014-04-15 ENCOUNTER — Other Ambulatory Visit: Payer: Self-pay

## 2014-04-15 ENCOUNTER — Ambulatory Visit (INDEPENDENT_AMBULATORY_CARE_PROVIDER_SITE_OTHER): Payer: PRIVATE HEALTH INSURANCE | Admitting: Nurse Practitioner

## 2014-04-15 ENCOUNTER — Ambulatory Visit
Admission: RE | Admit: 2014-04-15 | Discharge: 2014-04-15 | Disposition: A | Discharge status: Home / Self Care | Payer: PRIVATE HEALTH INSURANCE | Source: Ambulatory Visit

## 2014-04-15 ENCOUNTER — Encounter: Payer: Self-pay | Admitting: Nurse Practitioner

## 2014-04-15 VITALS — BP 114/66 | HR 68 | Ht 64.0 in | Wt 158.0 lb

## 2014-04-15 DIAGNOSIS — E7800 Pure hypercholesterolemia, unspecified: Secondary | ICD-10-CM

## 2014-04-15 DIAGNOSIS — Z01419 Encounter for gynecological examination (general) (routine) without abnormal findings: Secondary | ICD-10-CM

## 2014-04-15 DIAGNOSIS — Z1231 Encounter for screening mammogram for malignant neoplasm of breast: Secondary | ICD-10-CM

## 2014-04-15 DIAGNOSIS — M810 Age-related osteoporosis without current pathological fracture: Secondary | ICD-10-CM

## 2014-04-15 DIAGNOSIS — Z0000 Encounter for general adult medical examination without abnormal findings: Secondary | ICD-10-CM

## 2014-04-15 LAB — POCT URINALYSIS DIPSTICK
BILIRUBIN UA: NEGATIVE
Glucose, UA: NEGATIVE
Ketones, UA: NEGATIVE
Leukocytes, UA: NEGATIVE
Nitrite, UA: NEGATIVE
PH UA: 5
Protein, UA: NEGATIVE
RBC UA: NEGATIVE
Urobilinogen, UA: NEGATIVE

## 2014-04-15 NOTE — Patient Instructions (Signed)

## 2014-04-15 NOTE — Progress Notes (Signed)
Patient ID: Diana Armstrong, female   DOB: 19-Feb-1956, 59 y.o.   MRN: [id number] 59 y.o. [id number] Divorced  Caucasian Fe here for annual exam.  No new problems.  Cholesterol and triglycerides are well controlled.  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  Patient's last menstrual period was 05/26/2004.          Sexually active: No.  The current method of family planning is none.    Exercising: Yes.    Gym/ health club routine includes cardio and light weights.  Also uses elliptical and treadmill.  Walking outside of gym. Smoker:  Yes, 1/2 ppd  Health Maintenance: Pap:  02/11/12, negative with neg HR HPV MMG:  02/24/13, Bi-Rads 1:  Negative  Colonoscopy:  2005, pt is scheduled for screening on 05/10/14 BMD:   07/2012, osteoporosis of spine, Reclast IV 06/2013 at PCP TDaP:  2011 Shingles: never had Prevnar 13:  never had Labs:  HB:  PCP  Urine:  Negative    reports that she has been smoking Cigarettes.  She has a 18 pack-year smoking history. She has never used smokeless tobacco. She reports that she drinks about 1.8 oz of alcohol per week. She reports that she does not use illicit drugs.  Past Medical History  Diagnosis Date  . 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 Hemochromatosis   . Gallstones   . Sleep apnea   . Hyperlipidemia   . Kidney stones     Past Surgical History  Procedure Laterality Date  . No past surgeries      Current Outpatient Prescriptions  Medication Sig Dispense Refill  . aspirin 81 MG tablet Take 81 mg by mouth every other day.    . Biotin 5000 MCG CAPS Take by mouth daily.    . Calcium-Magnesium-Vitamin D (CALCIUM 1200+D3 PO) Take by mouth daily. Pt takes Calicum 5681 mg + 2751 mg D3 every day    . Cholecalciferol (VITAMIN D3) 1000 UNITS CAPS Take by mouth 2 (two) times daily.    . Coenzyme Q10 (COQ10) 200 MG CAPS Take by mouth daily.    Marland Kitchen esomeprazole (NEXIUM) 40 MG capsule Take 40 mg by mouth daily before breakfast.    .  fenofibrate 54 MG tablet Take 54 mg by mouth daily.    . fish oil-omega-3 fatty acids 1000 MG capsule Take 1,200 mg by mouth daily.    . Glucosamine-Chondroitin 1500-1200 MG/30ML LIQD Take by mouth daily.    Marland Kitchen MOVIPREP 100 G SOLR Take 1 kit (200 g total) by mouth once. 1 kit 0  . simvastatin (ZOCOR) 20 MG tablet Take 20 mg by mouth every evening.    Marland Kitchen SYNTHROID 100 MCG tablet Take 100 mcg by mouth daily.     Marland Kitchen tretinoin (RETIN-A) 0.1 % cream Apply topically as needed.   12  . zolpidem (AMBIEN) 5 MG tablet Take 5 mg by mouth as needed.   4   No current facility-administered medications for this visit.    Family History  Problem Relation Age of Onset  . Hemochromatosis Mother   . Hypertension Mother   . Thyroid disease Mother     hypo  . Hemochromatosis Father   . Hemochromatosis Brother   . Hemochromatosis Brother   . Thyroid disease Daughter     hypo  . Colon cancer Neg Hx   . Colon polyps Sister   . Colon polyps Brother   . Diabetes Neg Hx   . Esophageal  cancer Neg Hx   . Gallbladder disease Neg Hx     ROS:  Pertinent items are noted in HPI.  Otherwise, a comprehensive ROS was negative.  Exam:   BP 114/66 mmHg  Pulse 68  Ht _0  (1.626 m)  Wt 158 lb (71.668 kg)  BMI 27.11 kg/m2  LMP 05/26/2004 Height: _1  (162.6 cm) Ht Readings from Last 3 Encounters:  04/15/14 _2  (1.626 m)  03/24/14 _3  (1.626 m)  08/06/13 _4  (1.651 m)    General appearance: alert, cooperative and appears stated age Head: Normocephalic, without obvious abnormality, atraumatic Neck: no adenopathy, supple, symmetrical, trachea midline and thyroid normal to inspection and palpation Lungs: clear to auscultation bilaterally Breasts: normal appearance, no masses or tenderness Heart: regular rate and rhythm Abdomen: soft, non-tender; no masses,  no organomegaly Extremities: extremities normal, atraumatic, no cyanosis or edema Skin: Skin color, texture, turgor normal. No rashes or lesions.  Several skin areas treated with am with freezing treatments at dermatology Lymph nodes: Cervical, supraclavicular, and axillary nodes normal. No abnormal inguinal nodes palpated Neurologic: Grossly normal   Pelvic: External genitalia:  no lesions              Urethra:  normal appearing urethra with no masses, tenderness or lesions              Bartholin's and Skene's: normal                 Vagina: normal appearing vagina with normal color and discharge, no lesions              Cervix: anteverted              Pap taken: Yes.   Bimanual Exam:  Uterus:  normal size, contour, position, consistency, mobility, non-tender              Adnexa: no mass, fullness, tenderness               Rectovaginal: Confirms               Anus:  normal sphincter tone, no lesions  Chaperone present:  yes  A:  Well Woman with normal exam  Postmenopausal no HRT Hypothyroid on replacement Osteoporosis of spine - Reclast IV 06/2013 - managed by PCP   P:   Reviewed health and wellness pertinent to exam  Pap smear taken today  Mammogram is due now and will schedule  Counseled on breast self exam, mammography screening, adequate intake of calcium and vitamin D, diet and exercise return annually or prn  An After Visit Summary was printed and given to the patient.

## 2014-04-19 LAB — IPS PAP TEST WITH HPV

## 2014-04-19 NOTE — Progress Notes (Signed)
Encounter reviewed by Dr. Brook Silva.  

## 2014-04-29 ENCOUNTER — Other Ambulatory Visit: Payer: Self-pay | Admitting: Internal Medicine

## 2014-04-29 DIAGNOSIS — F17219 Nicotine dependence, cigarettes, with unspecified nicotine-induced disorders: Secondary | ICD-10-CM

## 2014-05-10 ENCOUNTER — Ambulatory Visit (AMBULATORY_SURGERY_CENTER): Payer: PRIVATE HEALTH INSURANCE | Admitting: Internal Medicine

## 2014-05-10 ENCOUNTER — Encounter: Payer: Self-pay | Admitting: Internal Medicine

## 2014-05-10 VITALS — BP 119/68 | HR 49 | Temp 98.8°F | Resp 20 | Ht 64.0 in | Wt 157.0 lb

## 2014-05-10 DIAGNOSIS — D122 Benign neoplasm of ascending colon: Secondary | ICD-10-CM

## 2014-05-10 DIAGNOSIS — K621 Rectal polyp: Secondary | ICD-10-CM

## 2014-05-10 DIAGNOSIS — D123 Benign neoplasm of transverse colon: Secondary | ICD-10-CM | POA: Diagnosis not present

## 2014-05-10 DIAGNOSIS — Z1211 Encounter for screening for malignant neoplasm of colon: Secondary | ICD-10-CM

## 2014-05-10 MED ORDER — SODIUM CHLORIDE 0.9 % IV SOLN: 500.0000 mL | INTRAVENOUS | Status: DC

## 2014-05-10 NOTE — Op Note (Signed)
[facility]  [address]. [place], [address]   COLONOSCOPY PROCEDURE REPORT  PATIENT: Diana Armstrong, Diana Armstrong  MR#: [id number] BIRTHDATE: 04/13/1955 , 22  yrs. old GENDER: female ENDOSCOPIST: Jerene Bears, MD REFERRED BY:W.  Lutricia Feil, M.D. PROCEDURE DATE:  05/10/2014 PROCEDURE:   Colonoscopy, screening and Colonoscopy with snare polypectomy First Screening Colonoscopy - Avg.  risk and is 50 yrs.  old or older - No.  Prior Negative Screening - Now for repeat screening. 10 or more years since last screening  History of Adenoma - Now for follow-up colonoscopy & has been > or = to 3 yrs.  N/A ASA CLASS:   Class II INDICATIONS:Screening for colonic neoplasia and Colorectal Neoplasm Risk Assessment for this procedure is average risk. MEDICATIONS: Monitored anesthesia care and Propofol 500 mg IV  DESCRIPTION OF PROCEDURE:   After the risks benefits and alternatives of the procedure were thoroughly explained, informed consent was obtained.  The digital rectal exam revealed no rectal mass.   The LB 1528  endoscope was introduced through the anus and advanced to the cecum, which was identified by both the appendix and ileocecal valve. No adverse events experienced.   The quality of the prep was (MoviPrep was used) good.  The instrument was then slowly withdrawn as the colon was fully examined.  COLON FINDINGS: Four sessile polyps ranging from 4 to 73mm in size were found in the ascending colon (2), transverse colon (1), and rectum (1).  Polypectomies were performed with a cold snare.  The resection was complete, the polyp tissue was completely retrieved and sent to histology.   There was mild diverticulosis noted in the left colon with associated tortuosity.  Retroflexed views revealed internal hemorrhoids. The time to cecum = 6 min, 29 sec Withdrawal time = 14 min, 09 sec   The scope was withdrawn and the procedure completed.  COMPLICATIONS: There were no immediate  complications.  ENDOSCOPIC IMPRESSION: 1.   Four sessile polyps ranging from 4 to 24mm in size were found in the ascending colon, transverse colon, and rectum; polypectomies were performed with a cold snare 2.   There was mild diverticulosis noted in the left colon   RECOMMENDATIONS: 1.  Avoid all NSAIDS for the next 2 weeks. 2.  Await pathology results 3.  High fiber diet 4.  Timing of repeat colonoscopy will be determined by pathology findings. 5.  You will receive a letter within 1-2 weeks with the results of your biopsy as well as final recommendations.  Please call my office if you have not received a letter after 3 weeks.  eSigned:  Jerene Bears, MD 05/10/2014 3:07 PM   cc: Janalyn Rouse, MD and The Patient   PATIENT NAME:  Diana Armstrong, Diana Armstrong MR#: [id number]

## 2014-05-10 NOTE — Progress Notes (Signed)
Called to room to assist during endoscopic procedure.  Patient ID and intended procedure confirmed with present staff. Received instructions for my participation in the procedure from the performing physician.  

## 2014-05-10 NOTE — Progress Notes (Signed)
Report to PACU, RN, vss, BBS= Clear.  

## 2014-05-10 NOTE — Patient Instructions (Signed)
Discharge instructions given. Handouts on polyps and diverticulosis. Resume previous medications. YOU HAD AN ENDOSCOPIC PROCEDURE TODAY AT THE [facility]:   Refer to the procedure report that was given to you for any specific questions about what was found during the examination.  If the procedure report does not answer your questions, please call your gastroenterologist to clarify.  If you requested that your care partner not be given the details of your procedure findings, then the procedure report has been included in a sealed envelope for you to review at your convenience later.  YOU SHOULD EXPECT: Some feelings of bloating in the abdomen. Passage of more gas than usual.  Walking can help get rid of the air that was put into your GI tract during the procedure and reduce the bloating. If you had a lower endoscopy (such as a colonoscopy or flexible sigmoidoscopy) you may notice spotting of blood in your stool or on the toilet paper. If you underwent a bowel prep for your procedure, you may not have a normal bowel movement for a few days.  Please Note:  You might notice some irritation and congestion in your nose or some drainage.  This is from the oxygen used during your procedure.  There is no need for concern and it should clear up in a day or so.  SYMPTOMS TO REPORT IMMEDIATELY:   Following lower endoscopy (colonoscopy or flexible sigmoidoscopy):  Excessive amounts of blood in the stool  Significant tenderness or worsening of abdominal pains  Swelling of the abdomen that is new, acute  Fever of 100F or higher   For urgent or emergent issues, a gastroenterologist can be reached at any hour by calling [phone].   DIET: Your first meal following the procedure should be a small meal and then it is ok to progress to your normal diet. Heavy or fried foods are harder to digest and may make you feel nauseous or bloated.  Likewise, meals heavy in dairy and vegetables can  increase bloating.  Drink plenty of fluids but you should avoid alcoholic beverages for 24 hours.  ACTIVITY:  You should plan to take it easy for the rest of today and you should NOT DRIVE or use heavy machinery until tomorrow (because of the sedation medicines used during the test).    FOLLOW UP: Our staff will call the number listed on your records the next business day following your procedure to check on you and address any questions or concerns that you may have regarding the information given to you following your procedure. If we do not reach you, we will leave a message.  However, if you are feeling well and you are not experiencing any problems, there is no need to return our call.  We will assume that you have returned to your regular daily activities without incident.  If any biopsies were taken you will be contacted by phone or by letter within the next 1-3 weeks.  Please call us at [phone] if you have not heard about the biopsies in 3 weeks.    SIGNATURES/CONFIDENTIALITY: You and/or your care partner have signed paperwork which will be entered into your electronic medical record.  These signatures attest to the fact that that the information above on your After Visit Summary has been reviewed and is understood.  Full responsibility of the confidentiality of this discharge information lies with you and/or your care-partner. 

## 2014-05-11 ENCOUNTER — Telehealth: Payer: Self-pay | Admitting: *Deleted

## 2014-05-11 NOTE — Telephone Encounter (Signed)
Left message that we called for f/u 

## 2014-05-16 ENCOUNTER — Encounter: Payer: Self-pay | Admitting: Internal Medicine

## 2014-05-17 ENCOUNTER — Ambulatory Visit
Admission: RE | Admit: 2014-05-17 | Discharge: 2014-05-17 | Disposition: A | Discharge status: Home / Self Care | Payer: No Typology Code available for payment source | Source: Ambulatory Visit | Attending: Internal Medicine | Admitting: Internal Medicine

## 2014-05-17 DIAGNOSIS — F17219 Nicotine dependence, cigarettes, with unspecified nicotine-induced disorders: Secondary | ICD-10-CM

## 2014-06-03 ENCOUNTER — Telehealth: Payer: Self-pay | Admitting: Internal Medicine

## 2014-06-03 NOTE — Telephone Encounter (Signed)
Left message for pt to call back.  Spoke with pt and her questions regarding path letter were answered.

## 2015-03-17 ENCOUNTER — Other Ambulatory Visit: Payer: Self-pay

## 2015-03-17 DIAGNOSIS — Z1231 Encounter for screening mammogram for malignant neoplasm of breast: Secondary | ICD-10-CM

## 2015-05-12 ENCOUNTER — Ambulatory Visit
Admission: RE | Admit: 2015-05-12 | Discharge: 2015-05-12 | Disposition: A | Discharge status: Home / Self Care | Payer: PRIVATE HEALTH INSURANCE | Source: Ambulatory Visit

## 2015-05-12 ENCOUNTER — Encounter: Payer: Self-pay | Admitting: Nurse Practitioner

## 2015-05-12 ENCOUNTER — Ambulatory Visit (INDEPENDENT_AMBULATORY_CARE_PROVIDER_SITE_OTHER): Payer: PRIVATE HEALTH INSURANCE | Admitting: Nurse Practitioner

## 2015-05-12 VITALS — BP 132/82 | HR 60 | Ht 65.5 in | Wt 155.0 lb

## 2015-05-12 DIAGNOSIS — E7800 Pure hypercholesterolemia, unspecified: Secondary | ICD-10-CM

## 2015-05-12 DIAGNOSIS — Z01419 Encounter for gynecological examination (general) (routine) without abnormal findings: Secondary | ICD-10-CM

## 2015-05-12 DIAGNOSIS — M810 Age-related osteoporosis without current pathological fracture: Secondary | ICD-10-CM

## 2015-05-12 DIAGNOSIS — Z1231 Encounter for screening mammogram for malignant neoplasm of breast: Secondary | ICD-10-CM

## 2015-05-12 DIAGNOSIS — Z0000 Encounter for general adult medical examination without abnormal findings: Secondary | ICD-10-CM

## 2015-05-12 NOTE — Patient Instructions (Addendum)

## 2015-05-12 NOTE — Progress Notes (Signed)
Patient ID: Diana Armstrong, female   DOB: 1955/07/30, 60 y.o.   MRN: [id number]  60 y.o. G2P2002 Divorced  Caucasian Fe here for annual exam.  Still travel 5 days a week.  Still active. No new diagnosis.  Patient's last menstrual period was 05/26/2004.          Sexually active: Yes.    The current method of family planning is abstinence and post menopausal status.    Exercising: Yes.    Gym/ health club routine includes treadmill and weights and elliptical 5 days per week. Smoker:  Yes, 1/2 pack per day  Health Maintenance: Pap: 04/15/14, Negative neg HR HPV MMG: 04/15/14, 3D, Bi-Rads 1: Negative, has repeat today Colonoscopy: 05/10/14, tubular adenoma, repeat in 5 years BMD:  2015 Osteoporosis and treated with Reclast X 2, now no Reclast advised for 2 yrs. TDaP:  2011? She has 2007 - will check with PCP Shingles: declined due to insurance Pneumonia: Not indicated due to age 31 C and HIV: discuss today Labs: Dr. Brigitte Pulse 05/12/15, urine with PCP also   reports that she has been smoking Cigarettes.  She has a 18 pack-year smoking history. She has never used smokeless tobacco. She reports that she drinks about 1.8 oz of alcohol per week. She reports that she does not use illicit drugs.  Past Medical History  Diagnosis Date  . 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 Hemochromatosis   . Gallstones   . Sleep apnea   . Hyperlipidemia   . Kidney stones     Past Surgical History  Procedure Laterality Date  . No past surgeries      Current Outpatient Prescriptions  Medication Sig Dispense Refill  . aspirin 81 MG tablet Take 81 mg by mouth every other day.    . Biotin 5000 MCG CAPS Take by mouth daily.    . Calcium-Magnesium-Vitamin D (CALCIUM 1200+D3 PO) Take by mouth daily. Pt takes Calicum 0000000 mg + 123XX123 mg D3 every day    . Cholecalciferol (VITAMIN D3) 1000 UNITS CAPS Take by mouth 2 (two) times daily.    . Coenzyme Q10 (COQ10) 200 MG CAPS Take by mouth daily.    Marland Kitchen  esomeprazole (NEXIUM) 40 MG capsule Take 40 mg by mouth daily before breakfast.    . fenofibrate 54 MG tablet Take 54 mg by mouth daily.    . fish oil-omega-3 fatty acids 1000 MG capsule Take 1,200 mg by mouth daily.    . Glucosamine-Chondroitin 1500-1200 MG/30ML LIQD Take by mouth daily.    . simvastatin (ZOCOR) 20 MG tablet Take 20 mg by mouth every evening.    Marland Kitchen SYNTHROID 100 MCG tablet Take 100 mcg by mouth daily.     Marland Kitchen tretinoin (RETIN-A) 0.1 % cream Apply topically as needed.   12  . zolpidem (AMBIEN) 5 MG tablet Take 5 mg by mouth as needed.   4   No current facility-administered medications for this visit.    Family History  Problem Relation Age of Onset  . Hemochromatosis Mother   . Hypertension Mother   . Thyroid disease Mother     hypo  . Hemochromatosis Father   . Hemochromatosis Brother   . Hemochromatosis Brother   . Thyroid disease Daughter     hypo  . Colon cancer Neg Hx   . Colon polyps Sister   . Colon polyps Brother   . Diabetes Neg Hx   . Esophageal cancer Neg Hx   .  Gallbladder disease Neg Hx     ROS:  Pertinent items are noted in HPI.  Otherwise, a comprehensive ROS was negative.  Exam:   BP 132/82 mmHg  Pulse 60  Ht 5' 5.5" (1.664 m)  Wt 155 lb (70.308 kg)  BMI 25.39 kg/m2  LMP 05/26/2004 Height: 5' 5.5" (166.4 cm) Ht Readings from Last 3 Encounters:  05/12/15 5' 5.5" (1.664 m)  05/10/14 5\' 4"  (1.626 m)  04/15/14 5\' 4"  (1.626 m)    General appearance: alert, cooperative and appears stated age Head: Normocephalic, without obvious abnormality, atraumatic Neck: no adenopathy, supple, symmetrical, trachea midline and thyroid normal to inspection and palpation Lungs: clear to auscultation bilaterally Breasts: normal appearance, no masses or tenderness   Heart: regular rate and rhythm Abdomen: soft, non-tender; no masses,  no organomegaly Extremities: extremities normal, atraumatic, no cyanosis or edema Skin: Skin color, texture, turgor normal.  No rashes or lesions Lymph nodes: Cervical, supraclavicular, and axillary nodes normal. No abnormal inguinal nodes palpated Neurologic: Grossly normal   Pelvic: External genitalia:  no lesions              Urethra:  normal appearing urethra with no masses, tenderness or lesions              Bartholin's and Skene's: normal                 Vagina: normal appearing vagina with normal color and discharge, no lesions              Cervix: anteverted              Pap taken: No. Bimanual Exam:  Uterus:  normal size, contour, position, consistency, mobility, non-tender              Adnexa: no mass, fullness, tenderness               Rectovaginal: Confirms               Anus:  normal sphincter tone, no lesions  Chaperone present: no  A:  Well Woman with normal exam  Postmenopausal no HRT Hypothyroid on replacement Osteoporosis of spine - Reclast IV 06/2013 - managed by PCP   P:   Reviewed health and wellness pertinent to exam  Pap smear as above  Mammogram is today  Labs will be done at PCP - will ask them about Hep C and HIV  Counseled on breast self exam, mammography screening, adequate intake of calcium and vitamin D, diet and exercise, Kegel's exercises return annually or prn  An After Visit Summary was printed and given to the patient.

## 2015-05-14 NOTE — Progress Notes (Signed)
Encounter reviewed by Dr. Brook Amundson C. Silva.  

## 2015-05-25 ENCOUNTER — Other Ambulatory Visit: Payer: Self-pay | Admitting: Internal Medicine

## 2015-05-25 DIAGNOSIS — F17210 Nicotine dependence, cigarettes, uncomplicated: Secondary | ICD-10-CM

## 2015-06-05 ENCOUNTER — Ambulatory Visit: Payer: PRIVATE HEALTH INSURANCE

## 2016-01-17 ENCOUNTER — Ambulatory Visit (INDEPENDENT_AMBULATORY_CARE_PROVIDER_SITE_OTHER): Payer: PRIVATE HEALTH INSURANCE

## 2016-01-17 ENCOUNTER — Ambulatory Visit (INDEPENDENT_AMBULATORY_CARE_PROVIDER_SITE_OTHER): Payer: PRIVATE HEALTH INSURANCE | Admitting: Podiatry

## 2016-01-17 VITALS — BP 134/74 | HR 64 | Temp 97.2°F | Resp 16 | Ht 65.0 in | Wt 150.0 lb

## 2016-01-17 DIAGNOSIS — M79672 Pain in left foot: Secondary | ICD-10-CM | POA: Diagnosis not present

## 2016-01-17 DIAGNOSIS — M79671 Pain in right foot: Secondary | ICD-10-CM

## 2016-01-17 DIAGNOSIS — M205X1 Other deformities of toe(s) (acquired), right foot: Secondary | ICD-10-CM

## 2016-01-17 DIAGNOSIS — M779 Enthesopathy, unspecified: Secondary | ICD-10-CM

## 2016-01-17 MED ORDER — TRIAMCINOLONE ACETONIDE 10 MG/ML IJ SUSP
10.0000 mg | Freq: Once | INTRAMUSCULAR | Status: AC
  Administered 2016-01-17: 10 mg

## 2016-01-17 NOTE — Progress Notes (Signed)
   Subjective:    Patient ID: Diana Armstrong, female    DOB: 01-Jul-1955, 60 y.o.   MRN: [id number]  HPI    Review of Systems  All other systems reviewed and are negative.      Objective:   Physical Exam        Assessment & Plan:

## 2016-01-17 NOTE — Progress Notes (Signed)
Subjective:     Patient ID: Diana Armstrong, female   DOB: 07-Sep-1955, 60 y.o.   MRN: [id number]  HPI patient presents stating my right foot has a painful bunion and the motion is getting increasingly tender for the last approximate year states she's tried wider shoes she's tried soaks she's tried shoe gear modifications without relief   Review of Systems  All other systems reviewed and are negative.      Objective:   Physical Exam  Constitutional: She is oriented to person, place, and time.  Cardiovascular: Intact distal pulses.   Musculoskeletal: Normal range of motion.  Neurological: She is oriented to person, place, and time.  Skin: Skin is warm.  Nursing note and vitals reviewed.  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 Patient will be seen back for [place] to recheck again in the next 3 weeks and orthotics were scanned today and then would like surgery bone want to wait till March and will require a biplanar-type osteotomy with explanation today that there is no guarantee as far as healing goes and possibilities exist that ultimately fusion or implantation procedure will be necessary  X-ray indicates there is spurring of the dorsal surface and structural bunion deformity right with mild narrowness of the joint surface but no indications of excessive  hallux rigidus deformity

## 2016-01-17 NOTE — Patient Instructions (Signed)
Hallux Rigidus Introduction Hallux rigidus is a type of joint pain or joint disease (arthritis) that affects your big toe (hallux). This condition involves the joint that connects the base of your big toe to the main part of your foot (metatarsophalangeal joint). This condition can cause your big toe to become stiff, painful, and difficult to move. Symptoms may get worse with movement or in cold or damp weather. The condition also gets worse over time. What are the causes? This condition may be caused by having a foot that does not function the way that it should or has an abnormal shape (structural deformity). These foot problems can run in families (be hereditary). This condition can also be caused by:  Injury.  Overuse.  Certain inflammatory diseases, including gout and rheumatoid arthritis. What increases the risk? This condition is more likely to develop in people who:  Have a foot bone (metatarsal) that is longer or higher than normal.  Have a family history of hallux rigidus.  Have previously injured their big toe.  Have feet that do not have a curve (arch) on the inner side of the foot. This may be called flat feet or fallen arches.  Turn their ankles in when they walk (pronation).  Have rheumatoid arthritis or gout.  Have to stoop down often at work. What are the signs or symptoms? Symptoms of this condition include:  Big toe pain.  Stiffness and difficulty moving the big toe.  Swelling of the toe and surrounding area.  Bone spurs. These are bony growths that can form on the joint of the big toe.  A limp. How is this diagnosed? This condition is diagnosed based on a medical history and physical exam. This may include X-rays. How is this treated? Treatment for this condition includes:  Wearing roomy, comfortable shoes that have a large toe box.  Putting orthotic devices in your shoes.  Pain medicines.  Physical therapy.  Icing the injured  area.  Alternate between putting your foot in cold water then warm water. If your condition is severe, treatment may include:  Corticosteroid injections to relieve pain.  Surgery to remove bone spurs, fuse damaged bones together, or replace the entire joint. Follow these instructions at home:  Take over-the-counter and prescription medicines only as told by your health care provider.  Do not wear high heels or other restrictive footwear. Wear comfortable, supportive shoes that have a large toe box.  Wear orthotics as told by your health care provider, if this applies.  Put your feet in cold water for 30 seconds, then in warm water for 30 seconds. Alternate between the cold and warm water for 5 minutes. Do this several times a day or as told by your health care provider.  If directed, apply ice to the injured area.  Put ice in a plastic bag.  Place a towel between your skin and the bag.  Leave the ice on for 20 minutes, 2-3 times per day.  Do foot exercises as instructed by your health care provider or a physical therapist.  Keep all follow-up visits as told by your health care provider. This is important. Contact a health care provider if:  You notice bone spurs or growths on or around your big toe.  Your pain does not get better or it gets worse.  You have pain while resting.  You have pain in other parts of your body, such as your back, hip, or knee.  You start to limp. This information is not  intended to replace advice given to you by your health care provider. Make sure you discuss any questions you have with your health care provider. Document Released: 02/11/2005 Document Revised: 07/20/2015 Document Reviewed: 10/19/2014  2017 [employer]vier

## 2016-02-08 ENCOUNTER — Ambulatory Visit (INDEPENDENT_AMBULATORY_CARE_PROVIDER_SITE_OTHER): Payer: PRIVATE HEALTH INSURANCE | Admitting: Podiatry

## 2016-02-08 DIAGNOSIS — M779 Enthesopathy, unspecified: Secondary | ICD-10-CM

## 2016-02-08 NOTE — Patient Instructions (Signed)

## 2016-04-01 ENCOUNTER — Other Ambulatory Visit: Payer: Self-pay | Admitting: Nurse Practitioner

## 2016-04-01 DIAGNOSIS — Z1231 Encounter for screening mammogram for malignant neoplasm of breast: Secondary | ICD-10-CM

## 2016-05-31 ENCOUNTER — Ambulatory Visit
Admission: RE | Admit: 2016-05-31 | Discharge: 2016-05-31 | Disposition: A | Discharge status: Home / Self Care | Payer: PRIVATE HEALTH INSURANCE | Source: Ambulatory Visit | Attending: Nurse Practitioner | Admitting: Nurse Practitioner

## 2016-05-31 ENCOUNTER — Encounter: Payer: Self-pay | Admitting: Certified Nurse Midwife

## 2016-05-31 ENCOUNTER — Ambulatory Visit (INDEPENDENT_AMBULATORY_CARE_PROVIDER_SITE_OTHER): Payer: PRIVATE HEALTH INSURANCE | Admitting: Certified Nurse Midwife

## 2016-05-31 ENCOUNTER — Ambulatory Visit: Payer: PRIVATE HEALTH INSURANCE | Admitting: Nurse Practitioner

## 2016-05-31 VITALS — BP 120/64 | HR 70 | Resp 16 | Ht 64.25 in | Wt 159.0 lb

## 2016-05-31 DIAGNOSIS — Z01419 Encounter for gynecological examination (general) (routine) without abnormal findings: Secondary | ICD-10-CM | POA: Diagnosis not present

## 2016-05-31 DIAGNOSIS — Z124 Encounter for screening for malignant neoplasm of cervix: Secondary | ICD-10-CM | POA: Diagnosis not present

## 2016-05-31 DIAGNOSIS — Z1231 Encounter for screening mammogram for malignant neoplasm of breast: Secondary | ICD-10-CM

## 2016-05-31 NOTE — Patient Instructions (Signed)

## 2016-05-31 NOTE — Progress Notes (Signed)
61 y.o. G84P2002 Divorced  Caucasian Fe here for annual exam. Menopausal  No HRT. Denies vaginal bleeding, or vaginal dryness. Sees PCP for hypertension and hypothyroid(previous hyperthyroid) management every 6 months, labs and aex. Doing well no health issues today that she is aware of.Eating well, stays active with exercise. Still smoking but has decreased amount. Planning time with family!  Patient's last menstrual period was 05/26/2004.          Sexually active: No.  The current method of family planning is post menopausal status.    Exercising: Yes.    weights, treadmill, machines, ellipitcal Smoker:  yes  Health Maintenance: Pap:  04/15/14 neg HPV HR neg MMG:  05-31-16 category c density birads 1:neg Colonoscopy:  2016 f/u 72yrs polyps BMD:   Done today TDaP:  2011 Shingles: had done Pneumonia: no Hep C and HIV: hep c neg Labs: pcp Self breast exam: done occ   reports that she has been smoking Cigarettes.  She has a 18.00 pack-year smoking history. She has never used smokeless tobacco. She reports that she drinks about 2.4 oz of alcohol per week . She reports that she does not use drugs.  Past Medical History:  Diagnosis Date  . Gallstones   . Hemochromatosis   . Hyperlipidemia   . 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 Kidney stones   . Sleep apnea     Past Surgical History:  Procedure Laterality Date  . BREAST BIOPSY Right 2010   Benign Stereo Biopsy  . NO PAST SURGERIES      Current Outpatient Prescriptions  Medication Sig Dispense Refill  . aspirin 81 MG tablet Take 81 mg by mouth every other day.    . Biotin 5000 MCG CAPS Take by mouth daily.    . Calcium-Magnesium-Vitamin D (CALCIUM 1200+D3 PO) Take by mouth daily. Pt takes Calicum 2952 mg + 8413 mg D3 every day    . Cholecalciferol (VITAMIN D3) 1000 UNITS CAPS Take by mouth 2 (two) times daily.    . Coenzyme Q10 (COQ10) 200 MG CAPS Take by mouth daily.    Marland Kitchen esomeprazole (NEXIUM) 40 MG capsule Take 40  mg by mouth daily before breakfast.    . fenofibrate 54 MG tablet Take 54 mg by mouth daily.    . fish oil-omega-3 fatty acids 1000 MG capsule Take 1,200 mg by mouth daily.    . Glucosamine-Chondroitin 1500-1200 MG/30ML LIQD Take by mouth daily.    . simvastatin (ZOCOR) 20 MG tablet Take 20 mg by mouth every evening.    Marland Kitchen SYNTHROID 100 MCG tablet Take 100 mcg by mouth daily.     Marland Kitchen tretinoin (RETIN-A) 0.1 % cream Apply topically as needed.   12  . TURMERIC PO Take by mouth daily.    Marland Kitchen zolpidem (AMBIEN) 5 MG tablet Take 5 mg by mouth as needed.   4   No current facility-administered medications for this visit.     Family History  Problem Relation Age of Onset  . Hemochromatosis Mother   . Hypertension Mother   . Thyroid disease Mother     hypo  . Hemochromatosis Father   . Hemochromatosis Brother   . Hemochromatosis Brother   . Thyroid disease Daughter     hypo  . Colon polyps Sister   . Colon polyps Brother   . Colon cancer Neg Hx   . Diabetes Neg Hx   . Esophageal cancer Neg Hx   . Gallbladder disease Neg Hx   .  Breast cancer Neg Hx     ROS:  Pertinent items are noted in HPI.  Otherwise, a comprehensive ROS was negative.  Exam:   BP 120/64   Pulse 70   Resp 16   Ht 5' 4.25" (1.632 m)   Wt 159 lb (72.1 kg)   LMP 05/26/2004   BMI 27.08 kg/m  Height: 5' 4.25" (163.2 cm) Ht Readings from Last 3 Encounters:  05/31/16 5' 4.25" (1.632 m)  01/17/16 5\' 5"  (1.651 m)  05/12/15 5' 5.5" (1.664 m)    General appearance: alert, cooperative and appears stated age Head: Normocephalic, without obvious abnormality, atraumatic Neck: no adenopathy, supple, symmetrical, trachea midline and thyroid normal to inspection and palpation Lungs: clear to auscultation bilaterally Breasts: normal appearance, no masses or tenderness, No nipple retraction or dimpling, No nipple discharge or bleeding, No axillary or supraclavicular adenopathy Heart: regular rate and rhythm Abdomen: soft,  non-tender; no masses,  no organomegaly Extremities: extremities normal, atraumatic, no cyanosis or edema Skin: Skin color, texture, turgor normal. No rashes or lesions Lymph nodes: Cervical, supraclavicular, and axillary nodes normal. No abnormal inguinal nodes palpated Neurologic: Grossly normal   Pelvic: External genitalia:  no lesions              Urethra:  normal appearing urethra with no masses, tenderness or lesions              Bartholin's and Skene's: normal                 Vagina: normal appearing vagina with normal color and discharge, no lesions              Cervix: no bleeding following Pap, no cervical motion tenderness and no lesions              Pap taken: Yes.   Bimanual Exam:  Uterus:  normal size, contour, position, consistency, mobility, non-tender and anteverted              Adnexa: normal adnexa and no mass, fullness, tenderness               Rectovaginal: Confirms               Anus:  normal sphincter tone, no lesions  Chaperone present: yes  A:  Well Woman with normal exam  Menopausal no HRT  Hypothyroid(previous Hyperthyroid)/hypertension management with MD  Smoker has decreased, but not interested in quitting  P:   Reviewed health and wellness pertinent to exam  Aware of need to advise if vaginal bleeding occurs.  Continue follow up with PCP as indicated  Discussed importance of not smoking. Patient aware and feels she is doing well.  Pap smear as above   counseled on breast self exam, mammography screening, adequate intake of calcium and vitamin D, diet and exercise, Kegel's exercises return annually or prn  An After Visit Summary was printed and given to the patient.

## 2016-06-03 LAB — IPS PAP TEST WITH REFLEX TO HPV

## 2016-06-05 NOTE — Progress Notes (Signed)
Encounter reviewed Diana Wicke, MD   

## 2016-06-06 ENCOUNTER — Other Ambulatory Visit: Payer: Self-pay | Admitting: Physician Assistant

## 2016-06-07 ENCOUNTER — Other Ambulatory Visit: Payer: Self-pay | Admitting: Internal Medicine

## 2016-06-07 ENCOUNTER — Ambulatory Visit: Payer: PRIVATE HEALTH INSURANCE | Admitting: Nurse Practitioner

## 2016-06-07 DIAGNOSIS — F17200 Nicotine dependence, unspecified, uncomplicated: Secondary | ICD-10-CM

## 2016-06-14 ENCOUNTER — Ambulatory Visit: Payer: PRIVATE HEALTH INSURANCE

## 2016-06-21 ENCOUNTER — Ambulatory Visit
Admission: RE | Admit: 2016-06-21 | Discharge: 2016-06-21 | Disposition: A | Discharge status: Home / Self Care | Payer: PRIVATE HEALTH INSURANCE | Source: Ambulatory Visit | Attending: Internal Medicine | Admitting: Internal Medicine

## 2016-06-21 DIAGNOSIS — F17200 Nicotine dependence, unspecified, uncomplicated: Secondary | ICD-10-CM

## 2016-08-05 ENCOUNTER — Ambulatory Visit (INDEPENDENT_AMBULATORY_CARE_PROVIDER_SITE_OTHER): Payer: PRIVATE HEALTH INSURANCE

## 2016-08-05 ENCOUNTER — Encounter: Payer: Self-pay | Admitting: Podiatry

## 2016-08-05 ENCOUNTER — Ambulatory Visit (INDEPENDENT_AMBULATORY_CARE_PROVIDER_SITE_OTHER): Payer: PRIVATE HEALTH INSURANCE | Admitting: Podiatry

## 2016-08-05 DIAGNOSIS — M779 Enthesopathy, unspecified: Secondary | ICD-10-CM

## 2016-08-05 DIAGNOSIS — M205X1 Other deformities of toe(s) (acquired), right foot: Secondary | ICD-10-CM

## 2016-08-05 MED ORDER — TRIAMCINOLONE ACETONIDE 10 MG/ML IJ SUSP
10.0000 mg | Freq: Once | INTRAMUSCULAR | Status: AC
Start: 2016-08-05 — End: 2016-08-05
  Administered 2016-08-05: 10 mg

## 2016-08-05 NOTE — Progress Notes (Signed)
Subjective:    Patient ID: Diana Armstrong, female   DOB: 61 y.o.   MRN: [id number]   HPI patient presents stating that she still getting a lot of pain in her big toe joint right and that it was improved for for 5 months and is gradually over the last couple months become increasingly sore    ROS      Objective:  Physical Exam neurovascular status intact with patient found to have inflammatory changes first MPJ right with dorsal spurring and pain upon palpation     Assessment:   Hallux limitus rigidus condition along with inflammatory capsulitis first MPJ      Plan:    H&P x-ray reviewed condition discussed. At this time I did inject around the joint surface 3 mg Kenalog 5 mg Xylocaine and I recommended that this can be done as long as the symptoms remain under control. Patient's x-rays reviewed and she understands someday she will probably require surgical intervention  X-rays indicate there is dorsal spurring around the first metatarsal head which has not grown in size since last visit

## 2017-01-10 ENCOUNTER — Encounter: Payer: Self-pay | Admitting: Podiatry

## 2017-01-10 ENCOUNTER — Ambulatory Visit (INDEPENDENT_AMBULATORY_CARE_PROVIDER_SITE_OTHER): Payer: PRIVATE HEALTH INSURANCE | Admitting: Podiatry

## 2017-01-10 DIAGNOSIS — M779 Enthesopathy, unspecified: Secondary | ICD-10-CM

## 2017-01-10 DIAGNOSIS — M205X1 Other deformities of toe(s) (acquired), right foot: Secondary | ICD-10-CM

## 2017-01-10 MED ORDER — TRIAMCINOLONE ACETONIDE 10 MG/ML IJ SUSP
10.0000 mg | Freq: Once | INTRAMUSCULAR | Status: AC
  Administered 2017-01-10: 10 mg

## 2017-01-15 NOTE — Progress Notes (Signed)
Subjective:    Patient ID: Diana Armstrong, female   DOB: 61 y.o.   MRN: [id number]   HPI patient states it's doing a lot better but still getting discomfort in the joint and I'm trying to stay ahead of    ROS      Objective:  Physical Exam neurovascular status intact with patient having discomfort in the right big toe joint     Assessment:    Inflammatory capsulitis first MPJ right     Plan:   Discussed that we can only do these occasionally patient understands and at this time careful injection administered around the first MPJ 3 mg Kenalog 5 mg Xylocaine

## 2017-04-21 ENCOUNTER — Other Ambulatory Visit: Payer: Self-pay | Admitting: Certified Nurse Midwife

## 2017-04-21 DIAGNOSIS — Z1231 Encounter for screening mammogram for malignant neoplasm of breast: Secondary | ICD-10-CM

## 2017-06-06 ENCOUNTER — Encounter: Payer: Self-pay | Admitting: Certified Nurse Midwife

## 2017-06-06 ENCOUNTER — Ambulatory Visit
Admission: RE | Admit: 2017-06-06 | Discharge: 2017-06-06 | Disposition: A | Discharge status: Home / Self Care | Payer: PRIVATE HEALTH INSURANCE | Source: Ambulatory Visit | Attending: Certified Nurse Midwife | Admitting: Certified Nurse Midwife

## 2017-06-06 ENCOUNTER — Other Ambulatory Visit: Payer: Self-pay

## 2017-06-06 ENCOUNTER — Ambulatory Visit: Payer: PRIVATE HEALTH INSURANCE | Admitting: Certified Nurse Midwife

## 2017-06-06 VITALS — BP 118/76 | HR 70 | Resp 16 | Ht 64.0 in | Wt 160.0 lb

## 2017-06-06 DIAGNOSIS — N951 Menopausal and female climacteric states: Secondary | ICD-10-CM

## 2017-06-06 DIAGNOSIS — Z1231 Encounter for screening mammogram for malignant neoplasm of breast: Secondary | ICD-10-CM

## 2017-06-06 DIAGNOSIS — Z01419 Encounter for gynecological examination (general) (routine) without abnormal findings: Secondary | ICD-10-CM | POA: Diagnosis not present

## 2017-06-06 DIAGNOSIS — Z8639 Personal history of other endocrine, nutritional and metabolic disease: Secondary | ICD-10-CM

## 2017-06-06 NOTE — Progress Notes (Signed)
62 y.o. G29P2002 Divorced  Caucasian Fe here for annual exam. Menopausal no HRT. Denies vaginal bleeding or vaginal dryness . Sees Dr. Brigitte Pulse for cholesterol,hypothyroid,insomnia management, aex, and labs. Doing well, staying active. Was in to see PCP in winter for Bronchitis, recovered without issues. Still working and traveling frequently, staying active with exercise and monitoring diet. Sees PCP for labs every 3 months. No health issues today.  Patient's last menstrual period was 05/26/2004.          Sexually active: No.  The current method of family planning is post menopausal status.    Exercising: Yes.    eliptical, weights & walking, machines Smoker:  yes  Health Maintenance: Pap:  04-15-14 neg HPV HR neg, 05-31-16 neg History of Abnormal Pap: no MMG:  05-31-16 category c density birads 1:neg, today Self Breast exams: yes Colonoscopy:  2016 f/u 85yrs BMD:   2018 normal per patient TDaP:  2018 Shingles: done Pneumonia: not done Hep C and HIV: hep c neg per patient Labs: with PCP Flu vaccine yes   reports that she has been smoking cigarettes.  She has a 18.00 pack-year smoking history. She has never used smokeless tobacco. She reports that she drinks about 1.8 oz of alcohol per week. She reports that she does not use drugs.  Past Medical History:  Diagnosis Date  . Gallstones   . Hemochromatosis   . Hyperlipidemia   . 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 Kidney stones   . Sleep apnea     Past Surgical History:  Procedure Laterality Date  . BREAST BIOPSY Right 2010   Benign Stereo Biopsy  . NO PAST SURGERIES      Current Outpatient Medications  Medication Sig Dispense Refill  . aspirin 81 MG tablet Take 81 mg by mouth every other day.    . Biotin 5000 MCG CAPS Take by mouth daily.    . Calcium-Magnesium-Vitamin D (CALCIUM 1200+D3 PO) Take by mouth daily. Pt takes Calicum 0263 mg + 7858 mg D3 every day    . Cholecalciferol (VITAMIN D3) 1000 UNITS CAPS Take by  mouth 2 (two) times daily.    . Coenzyme Q10 (COQ10) 200 MG CAPS Take by mouth daily.    Marland Kitchen esomeprazole (NEXIUM) 40 MG capsule Take 40 mg by mouth daily before breakfast.    . fenofibrate 54 MG tablet Take 54 mg by mouth daily.    . fish oil-omega-3 fatty acids 1000 MG capsule Take 1,200 mg by mouth daily.    . Glucosamine-Chondroitin 1500-1200 MG/30ML LIQD Take by mouth daily.    Marland Kitchen MAGNESIUM PO Take by mouth.    . naproxen (NAPROSYN) 500 MG tablet TK 1 T PO BID FOR 2 WEEKS THEN TK PRN P  3  . Probiotic Product (PROBIOTIC PO) Take by mouth.    . simvastatin (ZOCOR) 20 MG tablet Take 20 mg by mouth every evening.    Marland Kitchen SYNTHROID 112 MCG tablet TK 1 T PO D  6  . tretinoin (RETIN-A) 0.1 % cream Apply topically as needed.   12  . TURMERIC PO Take by mouth daily.    Marland Kitchen zolpidem (AMBIEN) 5 MG tablet Take 5 mg by mouth as needed.   4   No current facility-administered medications for this visit.     Family History  Problem Relation Age of Onset  . Hemochromatosis Mother   . Hypertension Mother   . Thyroid disease Mother        hypo  .  Hemochromatosis Father   . Hemochromatosis Brother   . Hemochromatosis Brother   . Thyroid disease Daughter        hypo  . Colon polyps Sister   . Colon polyps Brother   . Colon cancer Neg Hx   . Diabetes Neg Hx   . Esophageal cancer Neg Hx   . Gallbladder disease Neg Hx   . Breast cancer Neg Hx     ROS:  Pertinent items are noted in HPI.  Otherwise, a comprehensive ROS was negative.  Exam:   BP 118/76   Pulse 70   Resp 16   Ht 5\' 4"  (1.626 m)   Wt 160 lb (72.6 kg)   LMP 05/26/2004   BMI 27.46 kg/m  Height: 5\' 4"  (162.6 cm) Ht Readings from Last 3 Encounters:  06/06/17 5\' 4"  (1.626 m)  05/31/16 5' 4.25" (1.632 m)  01/17/16 5\' 5"  (1.651 m)    General appearance: alert, cooperative and appears stated age Head: Normocephalic, without obvious abnormality, atraumatic Neck: no adenopathy, supple, symmetrical, trachea midline and thyroid normal  to inspection and palpation Lungs: clear to auscultation bilaterally Breasts: normal appearance, no masses or tenderness, No nipple retraction or dimpling, No nipple discharge or bleeding, No axillary or supraclavicular adenopathy Heart: regular rate and rhythm Abdomen: soft, non-tender; no masses,  no organomegaly Extremities: extremities normal, atraumatic, no cyanosis or edema Skin: Skin color, texture, turgor normal. No rashes or lesions Lymph nodes: Cervical, supraclavicular, and axillary nodes normal. No abnormal inguinal nodes palpated Neurologic: Grossly normal   Pelvic: External genitalia:  no lesions              Urethra:  normal appearing urethra with no masses, tenderness or lesions              Bartholin's and Skene's: normal                 Vagina: normal appearing vagina with normal color and discharge, no lesions              Cervix: no cervical motion tenderness, no lesions and normal appearance              Pap taken: No. Bimanual Exam:  Uterus:  normal size, contour, position, consistency, mobility, non-tender and anteverted              Adnexa: normal adnexa and no mass, fullness, tenderness               Rectovaginal: Confirms               Anus:  normal sphincter tone, no lesions  Chaperone present: yes  A:  Well Woman with normal exam  Menopausal no HRT  Hypothyroid,Cholesterol, insomnia, vitamin D management with PCP  Smoker    P:   Reviewed health and wellness pertinent to exam  Aware of  Need to advise if vaginal bleeding or vaginal dryness occurrence  Continue follow up with MD as indicated  Encouraged to reduce amount of smoking   Pap smear: no   counseled on breast self exam, mammography screening, feminine hygiene, adequate intake of calcium and vitamin D, diet and exercise, Kegel's exercises  return annually or prn  An After Visit Summary was printed and given to the patient.

## 2017-06-29 ENCOUNTER — Other Ambulatory Visit: Payer: Self-pay | Admitting: Internal Medicine

## 2017-06-29 DIAGNOSIS — F17209 Nicotine dependence, unspecified, with unspecified nicotine-induced disorders: Secondary | ICD-10-CM

## 2017-06-29 DIAGNOSIS — R1904 Left lower quadrant abdominal swelling, mass and lump: Secondary | ICD-10-CM

## 2017-07-04 ENCOUNTER — Ambulatory Visit: Payer: PRIVATE HEALTH INSURANCE

## 2017-07-04 ENCOUNTER — Other Ambulatory Visit: Payer: PRIVATE HEALTH INSURANCE

## 2017-07-22 ENCOUNTER — Ambulatory Visit
Admission: RE | Admit: 2017-07-22 | Discharge: 2017-07-22 | Disposition: A | Discharge status: Home / Self Care | Payer: PRIVATE HEALTH INSURANCE | Source: Ambulatory Visit | Attending: Internal Medicine | Admitting: Internal Medicine

## 2017-07-22 DIAGNOSIS — R1904 Left lower quadrant abdominal swelling, mass and lump: Secondary | ICD-10-CM

## 2017-07-22 DIAGNOSIS — F17209 Nicotine dependence, unspecified, with unspecified nicotine-induced disorders: Secondary | ICD-10-CM

## 2017-07-22 MED ORDER — IOPAMIDOL (ISOVUE-300) INJECTION 61%
100.0000 mL | Freq: Once | INTRAVENOUS | Status: AC | PRN
  Administered 2017-07-22: 100 mL via INTRAVENOUS

## 2017-08-22 ENCOUNTER — Ambulatory Visit: Payer: PRIVATE HEALTH INSURANCE | Admitting: Podiatry

## 2017-08-22 ENCOUNTER — Encounter: Payer: Self-pay | Admitting: Podiatry

## 2017-08-22 DIAGNOSIS — M779 Enthesopathy, unspecified: Secondary | ICD-10-CM | POA: Diagnosis not present

## 2017-08-22 DIAGNOSIS — M205X1 Other deformities of toe(s) (acquired), right foot: Secondary | ICD-10-CM

## 2017-08-22 MED ORDER — TRIAMCINOLONE ACETONIDE 10 MG/ML IJ SUSP
10.0000 mg | Freq: Once | INTRAMUSCULAR | Status: AC
  Administered 2017-08-22: 10 mg

## 2017-08-22 NOTE — Progress Notes (Signed)
Subjective:   Patient ID: Diana Armstrong, female   DOB: 62 y.o.   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

## 2017-11-14 ENCOUNTER — Other Ambulatory Visit: Payer: Self-pay | Admitting: Physician Assistant

## 2017-11-14 DIAGNOSIS — C4491 Basal cell carcinoma of skin, unspecified: Secondary | ICD-10-CM

## 2017-11-14 HISTORY — DX: Basal cell carcinoma of skin, unspecified: C44.91

## 2018-01-30 ENCOUNTER — Encounter: Payer: Self-pay | Admitting: Podiatry

## 2018-01-30 ENCOUNTER — Ambulatory Visit: Payer: PRIVATE HEALTH INSURANCE | Admitting: Podiatry

## 2018-01-30 ENCOUNTER — Ambulatory Visit (INDEPENDENT_AMBULATORY_CARE_PROVIDER_SITE_OTHER): Payer: PRIVATE HEALTH INSURANCE

## 2018-01-30 DIAGNOSIS — M205X1 Other deformities of toe(s) (acquired), right foot: Secondary | ICD-10-CM

## 2018-01-30 DIAGNOSIS — M7751 Other enthesopathy of right foot: Secondary | ICD-10-CM

## 2018-01-30 DIAGNOSIS — M779 Enthesopathy, unspecified: Secondary | ICD-10-CM

## 2018-01-30 MED ORDER — TRIAMCINOLONE ACETONIDE 10 MG/ML IJ SUSP
10.0000 mg | Freq: Once | INTRAMUSCULAR | Status: AC
  Administered 2018-01-30: 10 mg

## 2018-02-01 NOTE — Progress Notes (Signed)
Subjective:   Patient ID: Diana Armstrong, female   DOB: 62 y.o.   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

## 2018-04-09 ENCOUNTER — Other Ambulatory Visit: Payer: Self-pay | Admitting: Internal Medicine

## 2018-04-09 DIAGNOSIS — R1904 Left lower quadrant abdominal swelling, mass and lump: Secondary | ICD-10-CM

## 2018-04-10 ENCOUNTER — Ambulatory Visit
Admission: RE | Admit: 2018-04-10 | Discharge: 2018-04-10 | Disposition: A | Discharge status: Home / Self Care | Payer: PRIVATE HEALTH INSURANCE | Source: Ambulatory Visit | Attending: Internal Medicine | Admitting: Internal Medicine

## 2018-04-10 DIAGNOSIS — R1904 Left lower quadrant abdominal swelling, mass and lump: Secondary | ICD-10-CM

## 2018-04-24 ENCOUNTER — Other Ambulatory Visit: Payer: Self-pay | Admitting: Certified Nurse Midwife

## 2018-04-24 DIAGNOSIS — Z1231 Encounter for screening mammogram for malignant neoplasm of breast: Secondary | ICD-10-CM

## 2018-06-02 DIAGNOSIS — M25561 Pain in right knee: Secondary | ICD-10-CM | POA: Insufficient documentation

## 2018-07-03 ENCOUNTER — Ambulatory Visit: Payer: PRIVATE HEALTH INSURANCE | Admitting: Certified Nurse Midwife

## 2018-07-03 ENCOUNTER — Ambulatory Visit: Payer: Self-pay

## 2018-07-15 ENCOUNTER — Other Ambulatory Visit: Payer: Self-pay | Admitting: Internal Medicine

## 2018-07-15 DIAGNOSIS — F17200 Nicotine dependence, unspecified, uncomplicated: Secondary | ICD-10-CM

## 2018-07-21 ENCOUNTER — Other Ambulatory Visit: Payer: Self-pay | Admitting: Internal Medicine

## 2018-07-24 ENCOUNTER — Ambulatory Visit: Payer: Self-pay

## 2018-07-28 HISTORY — PX: MENISCUS DEBRIDEMENT: SHX5178

## 2018-07-31 ENCOUNTER — Ambulatory Visit
Admission: RE | Admit: 2018-07-31 | Discharge: 2018-07-31 | Disposition: A | Discharge status: Home / Self Care | Payer: 59 | Source: Ambulatory Visit | Attending: Certified Nurse Midwife | Admitting: Certified Nurse Midwife

## 2018-07-31 ENCOUNTER — Other Ambulatory Visit: Payer: Self-pay

## 2018-07-31 DIAGNOSIS — Z1231 Encounter for screening mammogram for malignant neoplasm of breast: Secondary | ICD-10-CM

## 2018-08-03 DIAGNOSIS — Z5189 Encounter for other specified aftercare: Secondary | ICD-10-CM | POA: Insufficient documentation

## 2018-08-21 ENCOUNTER — Other Ambulatory Visit: Payer: Self-pay

## 2018-08-21 ENCOUNTER — Telehealth: Payer: Self-pay

## 2018-08-21 ENCOUNTER — Ambulatory Visit (INDEPENDENT_AMBULATORY_CARE_PROVIDER_SITE_OTHER): Payer: 59

## 2018-08-21 ENCOUNTER — Encounter: Payer: Self-pay | Admitting: Podiatry

## 2018-08-21 ENCOUNTER — Ambulatory Visit: Payer: 59 | Admitting: Podiatry

## 2018-08-21 VITALS — Temp 97.8°F

## 2018-08-21 DIAGNOSIS — M779 Enthesopathy, unspecified: Secondary | ICD-10-CM

## 2018-08-21 DIAGNOSIS — M205X1 Other deformities of toe(s) (acquired), right foot: Secondary | ICD-10-CM

## 2018-08-21 NOTE — Telephone Encounter (Signed)
Pt states had injection in big toe and that the toe has become numb since then, would like to know if this is a normal reaction. Injection on 6.26.2020 Right 1st.

## 2018-08-21 NOTE — Progress Notes (Signed)
Subjective:   Patient ID: Diana Armstrong, female   DOB: 63 y.o.   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  At this point I did do a sterile prep of the joint I did inject the lateral capsule 3 mg Kenalog 5 g Xylocaine and across the dorsal surface.  Reappoint for [place] to recheck and will require surgical intervention in the long run with a biplanar type osteotomy  X-ray indicates that there is slight increase in spur size and what appears to be more stress on the joint surface right

## 2019-03-17 IMAGING — CT CT CHEST LUNG CANCER SCREENING LOW DOSE W/O CM
1 of 5 series · 15 of 40 positions shown, 19 images · non-contrast
Comparison: None.

CLINICAL DATA: 60-year-old female with 42 pack-year history of
smoking. Lung cancer screening.

EXAM:
CT CHEST WITHOUT CONTRAST LOW-DOSE FOR LUNG CANCER SCREENING
TECHNIQUE: Multidetector CT imaging of the chest was performed following the
standard protocol without IV contrast.

[Series 3: lung windows · axial · 0.69mm/px · z∈[-277,-22]mm · 15 of 229 slices shown, 19 images]
[im 13/229  mediastinal]
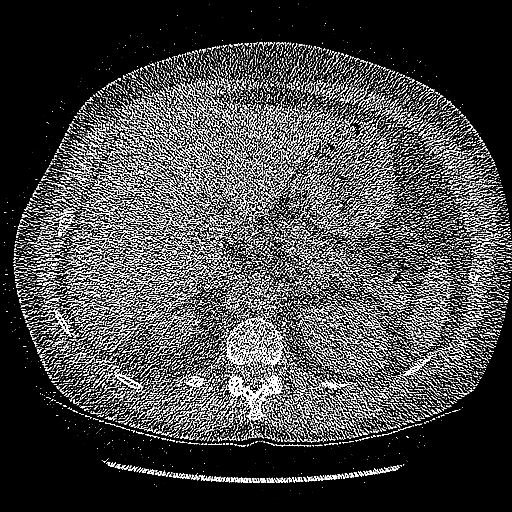
[im 13/229  lung]
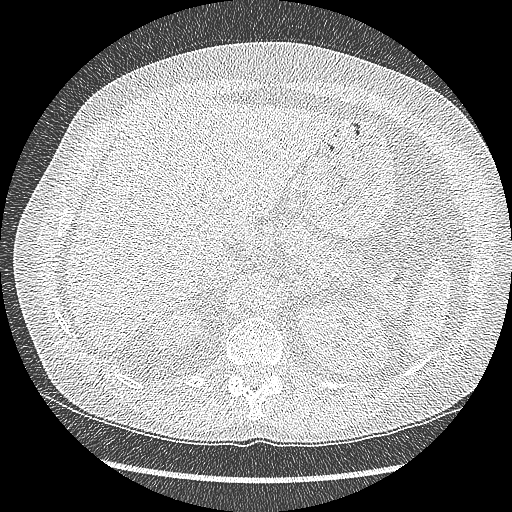
[im 25/229  lung]
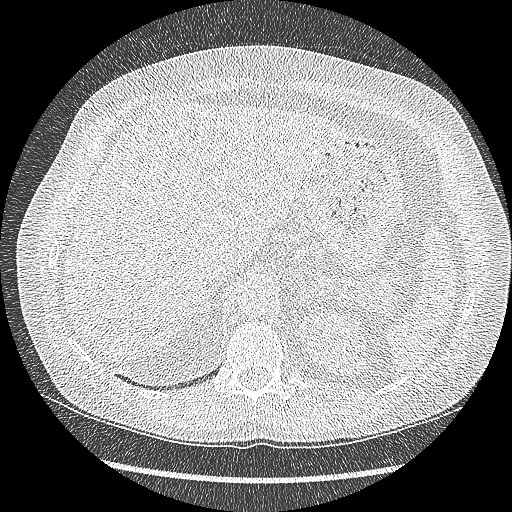
[im 49/229  lung]
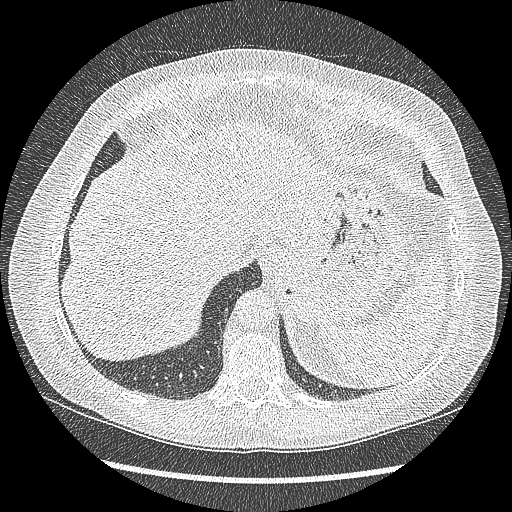
[im 61/229  lung]
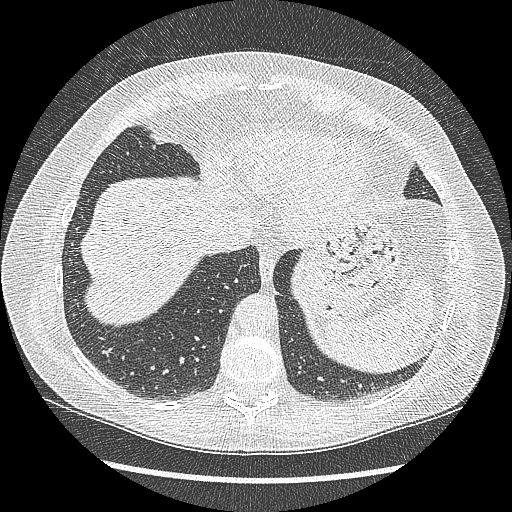
[im 73/229  mediastinal]
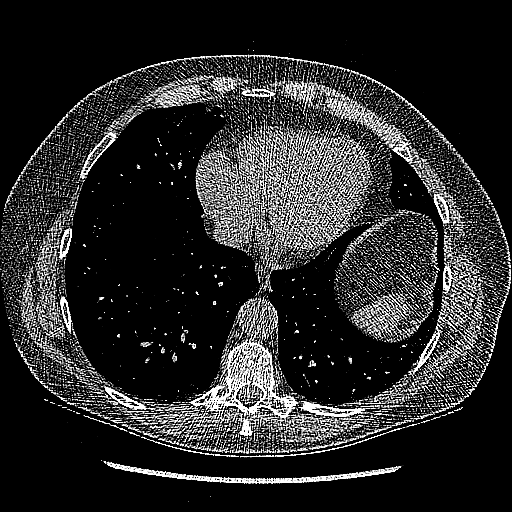
[im 73/229  lung]
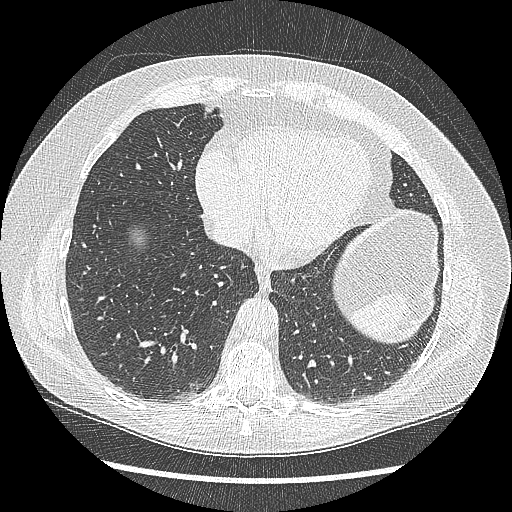
[im 85/229  lung]
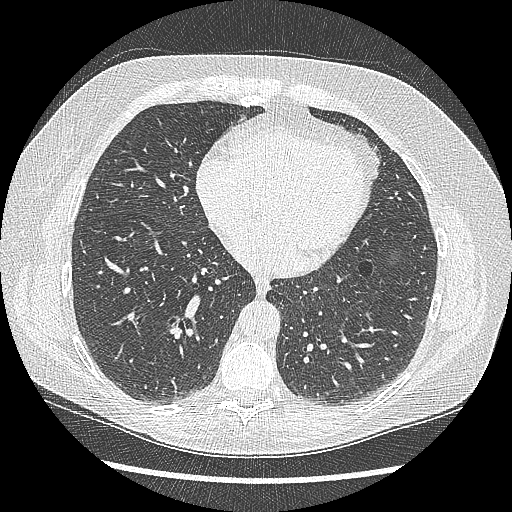
[im 97/229  lung]
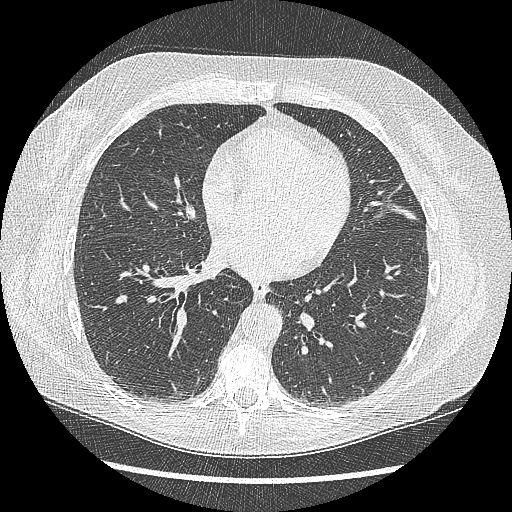
[im 121/229  lung]
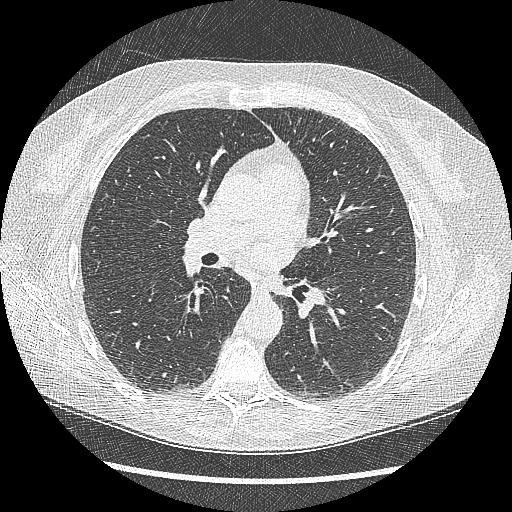
[im 133/229  mediastinal]
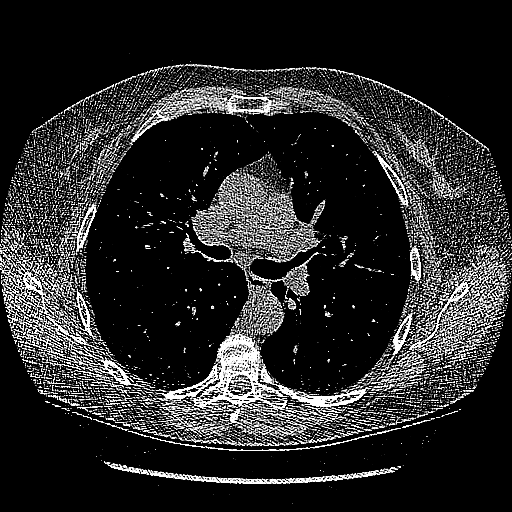
[im 133/229  lung]
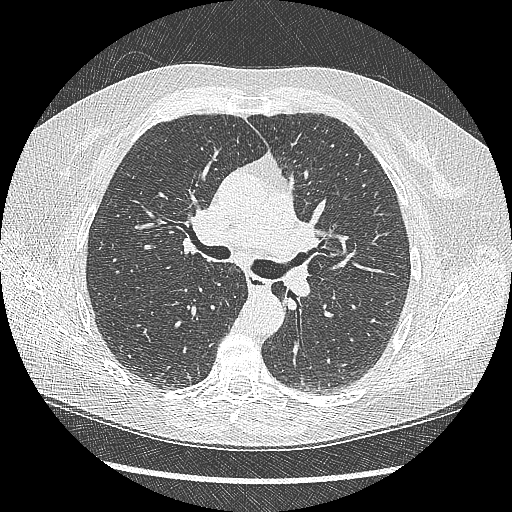
[im 145/229  lung]
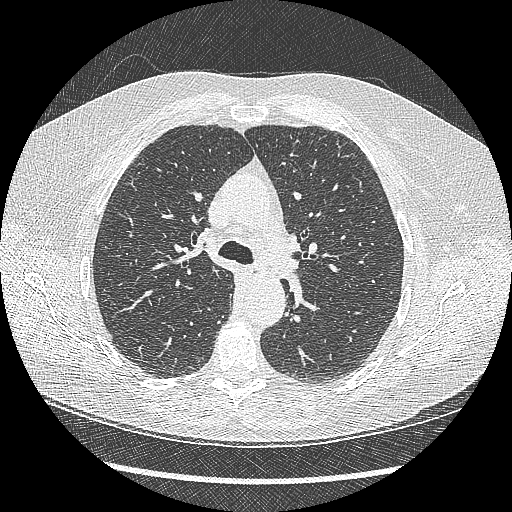
[im 157/229  lung]
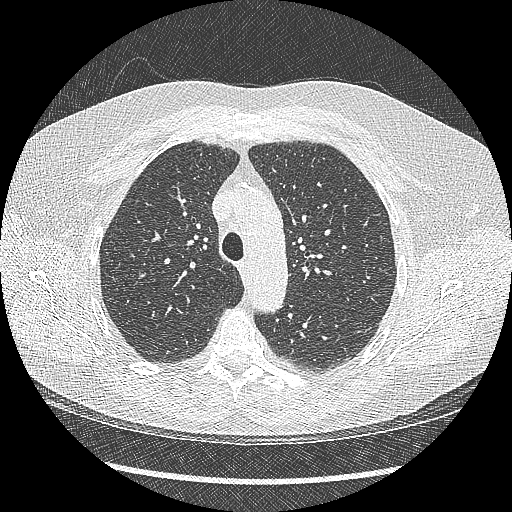
[im 169/229  lung]
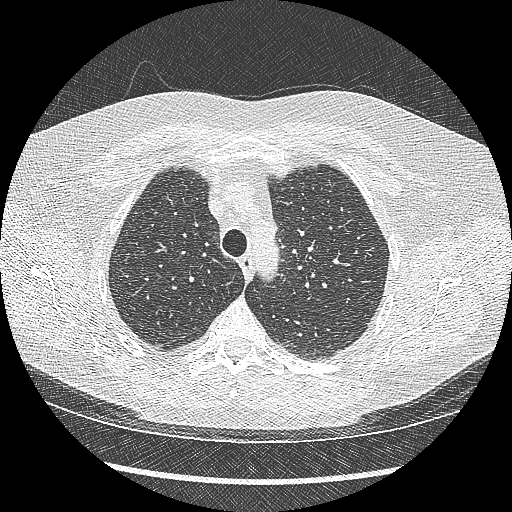
[im 193/229  mediastinal]
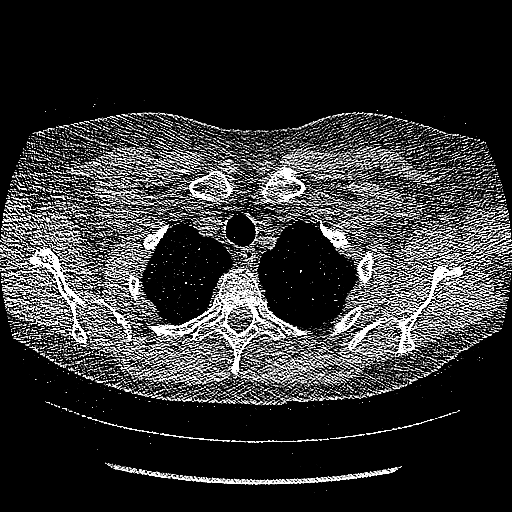
[im 193/229  lung]
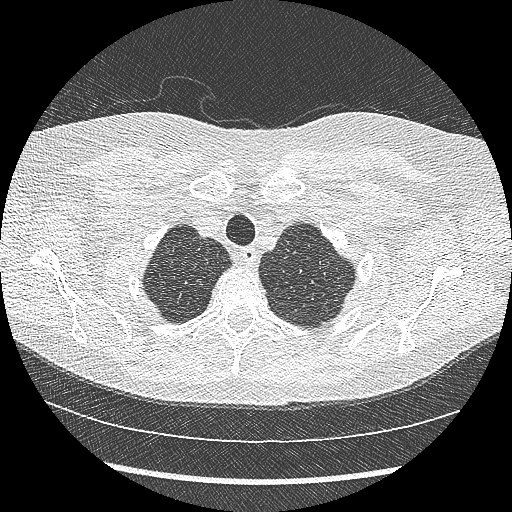
[im 205/229  lung]
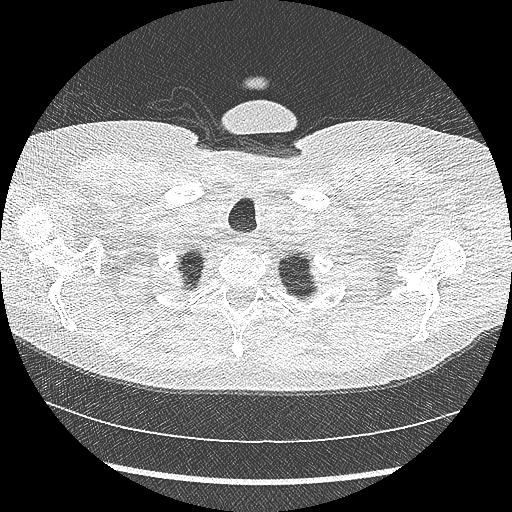
[im 217/229  lung]
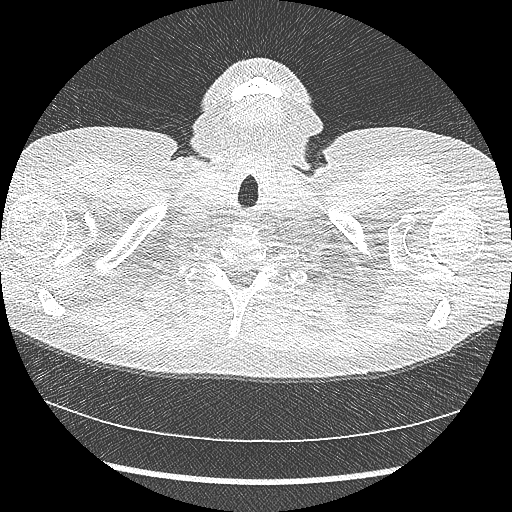

[15 of 40 positions shown; findings below may reference images not displayed]

FINDINGS: Cardiovascular: The heart size is normal.  No pericardial effusion.

Mediastinum/Nodes: No mediastinal lymphadenopathy. No evidence for
gross hilar lymphadenopathy although assessment is limited by the
lack of intravenous contrast on today's study. The esophagus has
normal imaging features. There is no axillary lymphadenopathy.

Lungs/Pleura: No suspicious pulmonary nodule or mass. Centrilobular
emphysema is identified with bronchial wall thickening. No focal
airspace consolidation. No pulmonary edema or pleural effusion.

Upper Abdomen: Unremarkable.

Musculoskeletal: Bone windows reveal no worrisome lytic or sclerotic
osseous lesions.
IMPRESSION: 1. Lung-RADS Category 1, negative. Continue annual screening with
low-dose chest CT without contrast in 12 months.

## 2019-03-19 ENCOUNTER — Ambulatory Visit: Payer: 59 | Attending: Internal Medicine

## 2019-03-19 DIAGNOSIS — Z23 Encounter for immunization: Secondary | ICD-10-CM | POA: Insufficient documentation

## 2019-03-19 NOTE — Progress Notes (Signed)
   Covid-19 Vaccination Clinic  Name:  Diana Armstrong    MRN: [id number] DOB: February 05, 1956  03/19/2019  [name] was observed post Covid-19 immunization for 15 minutes without incidence. She was provided with Vaccine Information Sheet and instruction to access the V-Safe system.   [name] was instructed to call 911 with any severe reactions post vaccine: Marland Kitchen Difficulty breathing  . Swelling of your face and throat  . A fast heartbeat  . A bad rash all over your body  . Dizziness and weakness    Immunizations Administered    Name Date Dose VIS Date Route   Pfizer COVID-19 Vaccine 03/19/2019  8:58 AM 0.3 mL 02/05/2019 Intramuscular   Manufacturer: [place]   Lot: [id number]   [place]: [id number]

## 2019-04-09 ENCOUNTER — Ambulatory Visit: Payer: 59 | Attending: Internal Medicine

## 2019-04-09 DIAGNOSIS — Z23 Encounter for immunization: Secondary | ICD-10-CM | POA: Insufficient documentation

## 2019-04-09 NOTE — Progress Notes (Signed)
   Covid-19 Vaccination Clinic  Name:  AYN CARRITHERS    MRN: [id number] DOB: [birth year]  04/09/2019  Ms. Batra was observed post Covid-19 immunization for 15 minutes without incidence. She was provided with Vaccine Information Sheet and instruction to access the V-Safe system.   Ms. Gladieux was instructed to call 911 with any severe reactions post vaccine: Marland Kitchen Difficulty breathing  . Swelling of your face and throat  . A fast heartbeat  . A bad rash all over your body  . Dizziness and weakness    Immunizations Administered    Name Date Dose VIS Date Route   Pfizer COVID-19 Vaccine 04/09/2019  9:33 AM 0.3 mL 02/05/2019 Intramuscular   Manufacturer: [place]   Lot: [id number]   [place]: [id number]

## 2019-04-30 ENCOUNTER — Encounter: Payer: Self-pay | Admitting: Internal Medicine

## 2019-05-07 ENCOUNTER — Other Ambulatory Visit: Payer: Self-pay | Admitting: Physician Assistant

## 2019-05-11 ENCOUNTER — Encounter: Payer: Self-pay | Admitting: *Deleted

## 2019-05-14 ENCOUNTER — Telehealth: Payer: Self-pay | Admitting: Physician Assistant

## 2019-05-14 NOTE — Telephone Encounter (Signed)
Patient called back and pathology results given. Patient aware she needs a 6 month follow up.

## 2019-05-14 NOTE — Telephone Encounter (Signed)
Patient called for pathology results from last visit with [facility].  Chart # [phone]

## 2019-05-17 NOTE — Telephone Encounter (Signed)
This encounter was created in error - please disregard.

## 2019-05-18 ENCOUNTER — Encounter: Payer: Self-pay | Admitting: Certified Nurse Midwife

## 2019-05-21 ENCOUNTER — Other Ambulatory Visit: Payer: Self-pay

## 2019-05-21 ENCOUNTER — Ambulatory Visit (AMBULATORY_SURGERY_CENTER): Payer: Self-pay

## 2019-05-21 VITALS — Temp 96.8°F | Ht 64.0 in | Wt 162.0 lb

## 2019-05-21 DIAGNOSIS — Z86010 Personal history of colonic polyps: Secondary | ICD-10-CM

## 2019-05-21 MED ORDER — NA SULFATE-K SULFATE-MG SULF 17.5-3.13-1.6 GM/177ML PO SOLN: 1.0000 | Freq: Once | ORAL | 0 refills | Status: AC

## 2019-05-21 NOTE — Progress Notes (Signed)
No allergies to soy or egg Pt is not on blood thinners or diet pills Denies issues with sedation/intubation Denies atrial flutter/fib Denies constipation   Emmi instructions given to pt  Pt is aware of Covid safety and care partner requirements.  

## 2019-06-03 ENCOUNTER — Encounter: Payer: Self-pay | Admitting: Internal Medicine

## 2019-06-04 ENCOUNTER — Ambulatory Visit (AMBULATORY_SURGERY_CENTER): Payer: 59 | Admitting: Internal Medicine

## 2019-06-04 ENCOUNTER — Other Ambulatory Visit: Payer: Self-pay

## 2019-06-04 ENCOUNTER — Encounter: Payer: Self-pay | Admitting: Internal Medicine

## 2019-06-04 VITALS — BP 131/68 | HR 54 | Temp 96.9°F | Resp 16 | Ht 64.0 in | Wt 162.0 lb

## 2019-06-04 DIAGNOSIS — D123 Benign neoplasm of transverse colon: Secondary | ICD-10-CM | POA: Diagnosis not present

## 2019-06-04 DIAGNOSIS — Z86010 Personal history of colonic polyps: Secondary | ICD-10-CM

## 2019-06-04 DIAGNOSIS — D120 Benign neoplasm of cecum: Secondary | ICD-10-CM

## 2019-06-04 DIAGNOSIS — K621 Rectal polyp: Secondary | ICD-10-CM

## 2019-06-04 DIAGNOSIS — D128 Benign neoplasm of rectum: Secondary | ICD-10-CM

## 2019-06-04 MED ORDER — SODIUM CHLORIDE 0.9 % IV SOLN: 500.0000 mL | Freq: Once | INTRAVENOUS | Status: DC

## 2019-06-04 NOTE — Progress Notes (Signed)
Pt's states no medical or surgical changes since previsit or office visit. 

## 2019-06-04 NOTE — Op Note (Signed)
[facility] Patient Name: Diana Armstrong Procedure Date: 06/04/2019 10:04 AM MRN: [id number] Endoscopist: Jerene Bears , MD Age: 64 Referring MD:  Date of Birth: 1955/10/05 Gender: Female Account #: [id number] Procedure:                Colonoscopy Indications:              High risk colon cancer surveillance: Personal                            history of non-advanced adenoma, Last colonoscopy:                            March 2016 Medicines:                Monitored Anesthesia Care Procedure:                Pre-Anesthesia Assessment:                           - Prior to the procedure, a History and Physical                            was performed, and patient medications and                            allergies were reviewed. The patient's tolerance of                            previous anesthesia was also reviewed. The risks                            and benefits of the procedure and the sedation                            options and risks were discussed with the patient.                            All questions were answered, and informed consent                            was obtained. Prior Anticoagulants: The patient has                            taken no previous anticoagulant or antiplatelet                            agents. ASA Grade Assessment: II - A patient with                            mild systemic disease. After reviewing the risks                            and benefits, the patient was deemed in  satisfactory condition to undergo the procedure.                           After obtaining informed consent, the colonoscope                            was passed under direct vision. Throughout the                            procedure, the patient's blood pressure, pulse, and                            oxygen saturations were monitored continuously. The                            Colonoscope was introduced through the anus and                             advanced to the cecum, identified by appendiceal                            orifice and ileocecal valve. The colonoscopy was                            performed without difficulty. The patient tolerated                            the procedure well. The quality of the bowel                            preparation was good. The ileocecal valve,                            appendiceal orifice, and rectum were photographed. Scope In: 10:10:57 AM Scope Out: 10:30:26 AM Scope Withdrawal Time: 0 hours 11 minutes 43 seconds  Total Procedure Duration: 0 hours 19 minutes 29 seconds  Findings:                 The digital rectal exam was normal.                           A 2 mm polyp was found in the cecum. The polyp was                            sessile. The polyp was removed with a cold biopsy                            forceps. Resection and retrieval were complete.                           A 4 mm polyp was found in the transverse colon. The                            polyp was  sessile. The polyp was removed with a                            cold snare. Resection and retrieval were complete.                           Two sessile polyps were found in the distal rectum.                            The polyps were 4 to 5 mm in size. These polyps                            were removed with a cold snare. Resection and                            retrieval were complete.                           Multiple small and large-mouthed diverticula were                            found in the sigmoid colon and hepatic flexure.                           There was a small lipoma, 10 mm in diameter, in the                            sigmoid colon.                           The retroflexed view of the distal rectum and anal                            verge was normal and showed no anal or rectal                            abnormalities. Complications:            No immediate  complications. Estimated Blood Loss:     Estimated blood loss was minimal. Impression:               - One 2 mm polyp in the cecum, removed with a cold                            biopsy forceps. Resected and retrieved.                           - One 4 mm polyp in the transverse colon, removed                            with a cold snare. Resected and retrieved.                           - Two 4 to 5 mm  polyps in the distal rectum,                            removed with a cold snare. Resected and retrieved.                           - Diverticulosis in the sigmoid colon and at the                            hepatic flexure.                           - Small lipoma in the sigmoid colon.                           - The distal rectum and anal verge are normal on                            retroflexion view. Recommendation:           - Patient has a contact number available for                            emergencies. The signs and symptoms of potential                            delayed complications were discussed with the                            patient. Return to normal activities tomorrow.                            Written discharge instructions were provided to the                            patient.                           - Resume previous diet.                           - Continue present medications.                           - Await pathology results.                           - Repeat colonoscopy is recommended for                            surveillance. The colonoscopy date will be                            determined after pathology results from today's                            exam become  available for review. Jerene Bears, MD 06/04/2019 10:33:57 AM This report has been signed electronically.

## 2019-06-04 NOTE — Progress Notes (Signed)
Called to room to assist during endoscopic procedure.  Patient ID and intended procedure confirmed with present staff. Received instructions for my participation in the procedure from the performing physician.  

## 2019-06-04 NOTE — Progress Notes (Signed)
PT taken to PACU. Monitors in place. VSS. Report given to RN. 

## 2019-06-04 NOTE — Patient Instructions (Signed)
Information on polyps and diverticulosis given to you today.  Await pathology results.  YOU HAD AN ENDOSCOPIC PROCEDURE TODAY AT [facility]:   Refer to the procedure report that was given to you for any specific questions about what was found during the examination.  If the procedure report does not answer your questions, please call your gastroenterologist to clarify.  If you requested that your care partner not be given the details of your procedure findings, then the procedure report has been included in a sealed envelope for you to review at your convenience later.  YOU SHOULD EXPECT: Some feelings of bloating in the abdomen. Passage of more gas than usual.  Walking can help get rid of the air that was put into your GI tract during the procedure and reduce the bloating. If you had a lower endoscopy (such as a colonoscopy or flexible sigmoidoscopy) you may notice spotting of blood in your stool or on the toilet paper. If you underwent a bowel prep for your procedure, you may not have a normal bowel movement for a few days.  Please Note:  You might notice some irritation and congestion in your nose or some drainage.  This is from the oxygen used during your procedure.  There is no need for concern and it should clear up in a day or so.  SYMPTOMS TO REPORT IMMEDIATELY:   Following lower endoscopy (colonoscopy or flexible sigmoidoscopy):  Excessive amounts of blood in the stool  Significant tenderness or worsening of abdominal pains  Swelling of the abdomen that is new, acute  Fever of 100F or higher   For urgent or emergent issues, a gastroenterologist can be reached at any hour by calling [phone]. Do not use MyChart messaging for urgent concerns.    DIET:  We do recommend a small meal at first, but then you may proceed to your regular diet.  Drink plenty of fluids but you should avoid alcoholic beverages for 24 hours.  ACTIVITY:  You should plan to take it easy  for the rest of today and you should NOT DRIVE or use heavy machinery until tomorrow (because of the sedation medicines used during the test).    FOLLOW UP: Our staff will call the number listed on your records 48-72 hours following your procedure to check on you and address any questions or concerns that you may have regarding the information given to you following your procedure. If we do not reach you, we will leave a message.  We will attempt to reach you two times.  During this call, we will ask if you have developed any symptoms of COVID 19. If you develop any symptoms (ie: fever, flu-like symptoms, shortness of breath, cough etc.) before then, please call [phone].  If you test positive for Covid 19 in the 2 weeks post procedure, please call and report this information to [place].    If any biopsies were taken you will be contacted by phone or by letter within the next 1-3 weeks.  Please call us at [phone] if you have not heard about the biopsies in 3 weeks.    SIGNATURES/CONFIDENTIALITY: You and/or your care partner have signed paperwork which will be entered into your electronic medical record.  These signatures attest to the fact that that the information above on your After Visit Summary has been reviewed and is understood.  Full responsibility of the confidentiality of this discharge information lies with you and/or your care-partner.

## 2019-06-08 ENCOUNTER — Telehealth: Payer: Self-pay

## 2019-06-08 ENCOUNTER — Encounter: Payer: Self-pay | Admitting: Internal Medicine

## 2019-06-08 NOTE — Telephone Encounter (Signed)
  Follow up Call-  Call back number 06/04/2019  Post procedure Call Back phone  # [phone]  Permission to leave phone message Yes  Some recent data might be hidden     Patient questions:  Do you have a fever, pain , or abdominal swelling? No. Pain Score  0 *  Have you tolerated food without any problems? Yes.    Have you been able to return to your normal activities? Yes.    Do you have any questions about your discharge instructions: Diet   No. Medications  No. Follow up visit  No.  Do you have questions or concerns about your Care? No.  Actions: * If pain score is 4 or above: No action needed, pain <4.  1. Have you developed a fever since your procedure? no  2.   Have you had an respiratory symptoms (SOB or cough) since your procedure? no  3.   Have you tested positive for COVID 19 since your procedure no  4.   Have you had any family members/close contacts diagnosed with the COVID 19 since your procedure?  no   If yes to any of these questions please route to Joylene John, RN and Erenest Rasher, RN

## 2019-06-18 ENCOUNTER — Other Ambulatory Visit: Payer: Self-pay | Admitting: Certified Nurse Midwife

## 2019-06-18 DIAGNOSIS — Z1231 Encounter for screening mammogram for malignant neoplasm of breast: Secondary | ICD-10-CM

## 2019-07-21 ENCOUNTER — Other Ambulatory Visit: Payer: Self-pay | Admitting: Internal Medicine

## 2019-07-21 DIAGNOSIS — F17209 Nicotine dependence, unspecified, with unspecified nicotine-induced disorders: Secondary | ICD-10-CM

## 2019-07-29 ENCOUNTER — Other Ambulatory Visit: Payer: Self-pay

## 2019-07-29 NOTE — Progress Notes (Signed)
64 y.o. G43P2002 Divorced White or Caucasian female here for annual exam.  Former pt of [facility] [employer].  Recently on Wellbutrin to stop smoking.  Will increase dosage next week.    PCP:  Dr. Brigitte Pulse.  Just had appt May 24th.    Patient's last menstrual period was 05/26/2004.          Sexually active: No.  The current method of family planning is post menopausal status.    Exercising: Yes.    weights, eliptical Smoker:  yes  Health Maintenance: Pap:  04-15-14 neg HPV HR neg, 05-31-16 neg History of abnormal Pap:  no MMG:  07-31-2018 category c density birads 1:neg, scheduled for 08-06-2019 Colonoscopy:  06-04-2019 f/u 7 yrs BMD:   2020 osteopenia f/u 2 yrs per patient TDaP:  2018 Pneumonia vaccine(s): 07-19-2019 Shingrix:   done Hep C testing: neg per patient Screening Labs: done with Dr. Brigitte Pulse   reports that she has been smoking cigarettes. She has a 18.00 pack-year smoking history. She has never used smokeless tobacco. She reports current alcohol use of about 3.0 standard drinks of alcohol per week. She reports that she does not use drugs.  Past Medical History:  Diagnosis Date  . Basal cell carcinoma 11/14/2017   right inner [id number]  . Gallstones   . GERD (gastroesophageal reflux disease)   . Hemochromatosis   . Hyperlipidemia   . 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 Kidney stones   . Sleep apnea    on CPAP    Past Surgical History:  Procedure Laterality Date  . BREAST BIOPSY Right 2010   Benign Stereo Biopsy  . MENISCUS DEBRIDEMENT Right 07/28/2018  . MOUTH SURGERY     roof of mouth biopsies    Current Outpatient Medications  Medication Sig Dispense Refill  . aspirin 81 MG tablet Take 81 mg by mouth every other day.    . Biotin 5000 MCG CAPS Take by mouth daily.    Marland Kitchen buPROPion (WELLBUTRIN XL) 150 MG 24 hr tablet TAKE 1 TABLET BY MOUTH DAILY FOR 1 WEEK. INCREASE TO 300 MG DAILY    . Calcium-Magnesium-Vitamin D (CALCIUM 1200+D3 PO) Take  by mouth daily. Pt takes Calicum 0000000 mg + 123XX123 mg D3 every day    . chlorhexidine (PERIDEX) 0.12 % solution 15 mLs 2 (two) times daily.    . Cholecalciferol (VITAMIN D3) 1000 UNITS CAPS Take by mouth 2 (two) times daily.    . Coenzyme Q10 (COQ10) 200 MG CAPS Take by mouth daily.    Mariane Baumgarten Sodium (COLACE PO) Take by mouth.    . esomeprazole (NEXIUM) 40 MG capsule Take 40 mg by mouth daily before breakfast.    . omega-3 acid ethyl esters (LOVAZA) 1 g capsule Take 4 capsules by mouth daily.    . rosuvastatin (CRESTOR) 10 MG tablet     . SYNTHROID 112 MCG tablet     . tretinoin (RETIN-A) 0.1 % cream Apply topically as needed.   12  . TURMERIC PO Take by mouth daily.    Marland Kitchen zolpidem (AMBIEN) 5 MG tablet Take 5 mg by mouth as needed.   4   No current facility-administered medications for this visit.    Family History  Problem Relation Age of Onset  . Hemochromatosis Mother   . Hypertension Mother   . Thyroid disease Mother        hypo  . Hemochromatosis Father   . Hemochromatosis Brother   .  Hemochromatosis Brother   . Thyroid disease Daughter        hypo  . Colon polyps Sister   . Colon polyps Brother     Review of Systems  Constitutional: Negative.   HENT: Negative.   Eyes: Negative.   Respiratory: Negative.   Cardiovascular: Negative.   Gastrointestinal: Negative.   Endocrine: Negative.   Genitourinary: Negative.   Musculoskeletal: Negative.   Skin: Negative.   Allergic/Immunologic: Negative.   Neurological: Negative.   Hematological: Negative.   Psychiatric/Behavioral: Negative.     Exam:   Temp (!) 97 F (36.1 C) (Skin)   Ht 5' 3.75" (1.619 m)   Wt 159 lb (72.1 kg)   LMP 05/26/2004   BMI 27.51 kg/m   Height: 5' 3.75" (161.9 cm)  General appearance: alert, cooperative and appears stated age Head: Normocephalic, without obvious abnormality, atraumatic Neck: no adenopathy, supple, symmetrical, trachea midline and thyroid normal to inspection and  palpation Lungs: clear to auscultation bilaterally Breasts: normal appearance, no masses or tenderness Heart: regular rate and rhythm Abdomen: soft, non-tender; bowel sounds normal; no masses,  no organomegaly Extremities: extremities normal, atraumatic, no cyanosis or edema Skin: Skin color, texture, turgor normal. No rashes or lesions Lymph nodes: Cervical, supraclavicular, and axillary nodes normal. No abnormal inguinal nodes palpated Neurologic: Grossly normal   Pelvic: External genitalia:  no lesions              Urethra:  normal appearing urethra with no masses, tenderness or lesions              Bartholins and Skenes: normal                 Vagina: normal appearing vagina with normal color and discharge, no lesions              Cervix: no lesions              Pap taken: Yes.   Bimanual Exam:  Uterus:  normal size, contour, position, consistency, mobility, non-tender              Adnexa: normal adnexa and no mass, fullness, tenderness               Rectovaginal: Confirms               Anus:  normal sphincter tone, no lesions  Chaperone, Terence Lux, CMA, was present for exam.  A:  Well Woman with normal exam PMP, no HRT H/o hypothyroidism Elevaed lipids Chronic insomnia Smoking hx, trying to quit  P:   Mammogram guidelines reviewed pap smear obtained today Colonoscopy and BMD up to day Lab work done in May with Dr. Brigitte Pulse Vaccines reviewed and UTD Return annually or prn

## 2019-07-30 ENCOUNTER — Encounter: Payer: Self-pay | Admitting: Obstetrics & Gynecology

## 2019-07-30 ENCOUNTER — Ambulatory Visit: Payer: 59 | Admitting: Certified Nurse Midwife

## 2019-07-30 ENCOUNTER — Ambulatory Visit: Payer: 59 | Admitting: Obstetrics & Gynecology

## 2019-07-30 ENCOUNTER — Other Ambulatory Visit: Payer: Self-pay

## 2019-07-30 ENCOUNTER — Other Ambulatory Visit (HOSPITAL_COMMUNITY)
Admission: RE | Admit: 2019-07-30 | Discharge: 2019-07-30 | Disposition: A | Discharge status: Home / Self Care | Payer: 59 | Source: Ambulatory Visit | Attending: Obstetrics & Gynecology | Admitting: Obstetrics & Gynecology

## 2019-07-30 VITALS — Temp 97.0°F | Ht 63.75 in | Wt 159.0 lb

## 2019-07-30 DIAGNOSIS — Z124 Encounter for screening for malignant neoplasm of cervix: Secondary | ICD-10-CM | POA: Diagnosis present

## 2019-07-30 DIAGNOSIS — E0590 Thyrotoxicosis, unspecified without thyrotoxic crisis or storm: Secondary | ICD-10-CM | POA: Diagnosis not present

## 2019-07-30 DIAGNOSIS — Z01419 Encounter for gynecological examination (general) (routine) without abnormal findings: Secondary | ICD-10-CM

## 2019-07-30 DIAGNOSIS — E782 Mixed hyperlipidemia: Secondary | ICD-10-CM

## 2019-07-30 DIAGNOSIS — E785 Hyperlipidemia, unspecified: Secondary | ICD-10-CM | POA: Insufficient documentation

## 2019-08-03 LAB — CYTOLOGY - PAP: Diagnosis: NEGATIVE

## 2019-08-06 ENCOUNTER — Ambulatory Visit
Admission: RE | Admit: 2019-08-06 | Discharge: 2019-08-06 | Disposition: A | Discharge status: Home / Self Care | Payer: 59 | Source: Ambulatory Visit | Attending: Internal Medicine | Admitting: Internal Medicine

## 2019-08-06 ENCOUNTER — Ambulatory Visit
Admission: RE | Admit: 2019-08-06 | Discharge: 2019-08-06 | Disposition: A | Discharge status: Home / Self Care | Payer: 59 | Source: Ambulatory Visit | Attending: *Deleted | Admitting: *Deleted

## 2019-08-06 ENCOUNTER — Other Ambulatory Visit: Payer: Self-pay

## 2019-08-06 DIAGNOSIS — Z1231 Encounter for screening mammogram for malignant neoplasm of breast: Secondary | ICD-10-CM

## 2019-08-06 DIAGNOSIS — F17209 Nicotine dependence, unspecified, with unspecified nicotine-induced disorders: Secondary | ICD-10-CM

## 2019-11-22 ENCOUNTER — Encounter: Payer: Self-pay | Admitting: Physician Assistant

## 2019-11-22 ENCOUNTER — Other Ambulatory Visit: Payer: Self-pay

## 2019-11-22 ENCOUNTER — Ambulatory Visit (INDEPENDENT_AMBULATORY_CARE_PROVIDER_SITE_OTHER): Payer: PRIVATE HEALTH INSURANCE | Admitting: Physician Assistant

## 2019-11-22 DIAGNOSIS — L570 Actinic keratosis: Secondary | ICD-10-CM

## 2019-11-22 DIAGNOSIS — L820 Inflamed seborrheic keratosis: Secondary | ICD-10-CM | POA: Diagnosis not present

## 2019-11-22 DIAGNOSIS — Z85828 Personal history of other malignant neoplasm of skin: Secondary | ICD-10-CM | POA: Diagnosis not present

## 2019-11-22 NOTE — Progress Notes (Signed)
   Follow up Visit  Subjective  Diana Armstrong is a 64 y.o. female who presents for the following: Skin Problem (Here to have some spots frozen. Face x 2, ankle, knee, and each arm. ). Many areas to be frozen. 2 on the left cheek that is crusty. Left leg has 3 large raised spots. Crusty on right ankle and 1 on each bicep that are crusty. No areas on back that are a problem at this time.  Objective  Well appearing patient in no apparent distress; mood and affect are within normal limits.  A focused examination was performed including face, arms, legs, back. Relevant physical exam findings are noted in the Assessment and Plan. No suspicious moles noted on back.   Objective  Left Parotid Area (2), Right Forearm - Anterior wrist (3): Erythematous patches with gritty scale.  Objective  Left Forearm - Anterior, Left Lower Leg - Anterior (5), Right Lower Leg - Anterior (6): Erythematous stuck-on, waxy papule or plaque.   Assessment & Plan  AK (actinic keratosis) (5) Right Forearm - Anterior wrist (3); Left Parotid Area (2)  Destruction of lesion - Left Parotid Area, Right Forearm - Anterior wrist Complexity: simple   Destruction method: cryotherapy   Informed consent: discussed and consent obtained   Timeout:  patient name, date of birth, surgical site, and procedure verified Lesion destroyed using liquid nitrogen: Yes   Cryotherapy cycles:  1 Outcome: patient tolerated procedure well with no complications   Post-procedure details: wound care instructions given    Inflamed seborrheic keratosis (12) Left Forearm - Anterior; Left Lower Leg - Anterior (5); Right Lower Leg - Anterior (6)  Destruction of lesion - Left Forearm - Anterior, Left Lower Leg - Anterior, Right Lower Leg - Anterior Complexity: simple   Destruction method: cryotherapy   Informed consent: discussed and consent obtained   Timeout:  patient name, date of birth, surgical site, and procedure verified Lesion  destroyed using liquid nitrogen: Yes   Cryotherapy cycles:  1 Outcome: patient tolerated procedure well with no complications   Post-procedure details: wound care instructions given    History of basal cell carcinoma (BCC) Right Medial Thigh

## 2020-04-24 ENCOUNTER — Other Ambulatory Visit: Payer: Self-pay | Admitting: Internal Medicine

## 2020-04-24 DIAGNOSIS — J0100 Acute maxillary sinusitis, unspecified: Secondary | ICD-10-CM

## 2020-06-01 ENCOUNTER — Other Ambulatory Visit: Payer: Self-pay

## 2020-06-01 ENCOUNTER — Encounter: Payer: Self-pay | Admitting: Physician Assistant

## 2020-06-01 ENCOUNTER — Ambulatory Visit (INDEPENDENT_AMBULATORY_CARE_PROVIDER_SITE_OTHER): Payer: PRIVATE HEALTH INSURANCE | Admitting: Physician Assistant

## 2020-06-01 DIAGNOSIS — L814 Other melanin hyperpigmentation: Secondary | ICD-10-CM | POA: Diagnosis not present

## 2020-06-01 DIAGNOSIS — L578 Other skin changes due to chronic exposure to nonionizing radiation: Secondary | ICD-10-CM

## 2020-06-01 DIAGNOSIS — Z1283 Encounter for screening for malignant neoplasm of skin: Secondary | ICD-10-CM

## 2020-06-01 DIAGNOSIS — L820 Inflamed seborrheic keratosis: Secondary | ICD-10-CM

## 2020-06-01 DIAGNOSIS — L821 Other seborrheic keratosis: Secondary | ICD-10-CM

## 2020-06-01 DIAGNOSIS — D1800 Hemangioma unspecified site: Secondary | ICD-10-CM

## 2020-06-01 DIAGNOSIS — D229 Melanocytic nevi, unspecified: Secondary | ICD-10-CM | POA: Diagnosis not present

## 2020-06-01 DIAGNOSIS — L570 Actinic keratosis: Secondary | ICD-10-CM

## 2020-06-01 NOTE — Progress Notes (Signed)
   Follow-Up Visit   Subjective  Diana Armstrong is a 65 y.o. female who presents for the following: Annual Exam (Patient here today for yearly skin check. Patient does have a history of BCC right inner thigh. No personal history of melanoma or non mole skin cancer.  Per patient she has a few lesions she would like LN2 today.).   The following portions of the chart were reviewed this encounter and updated as appropriate:  Tobacco  Allergies  Meds  Problems  Med Hx  Surg Hx  Fam Hx      Objective  Well appearing patient in no apparent distress; mood and affect are within normal limits.  A full examination was performed including scalp, head, eyes, ears, nose, lips, neck, chest, axillae, abdomen, back, buttocks, bilateral upper extremities, bilateral lower extremities, hands, feet, fingers, toes, fingernails, and toenails. All findings within normal limits unless otherwise noted below.  Objective  Right Forearm - Posterior (4): Erythematous patches with gritty scale.  Objective  Face (15), Right Forearm - Posterior (2), Right Shoulder - Posterior: Erythematous stuck-on, waxy plaque.    Assessment & Plan  AK (actinic keratosis) (4) Right Forearm - Posterior  Destruction of lesion - Right Forearm - Posterior Complexity: simple   Destruction method: cryotherapy   Informed consent: discussed and consent obtained   Timeout:  patient name, date of birth, surgical site, and procedure verified Lesion destroyed using liquid nitrogen: Yes   Cryotherapy cycles:  5 Outcome: patient tolerated procedure well with no complications   Post-procedure details: wound care instructions given    Inflamed seborrheic keratosis (18) Face (15); Right Shoulder - Posterior; Right Forearm - Posterior (2)  Destruction of lesion - Face, Right Forearm - Posterior, Right Shoulder - Posterior Complexity: simple   Destruction method: cryotherapy   Informed consent: discussed and consent obtained    Timeout:  patient name, date of birth, surgical site, and procedure verified Lesion destroyed using liquid nitrogen: Yes   Cryotherapy cycles:  5 Outcome: patient tolerated procedure well with no complications   Post-procedure details: wound care instructions given    Lentigines - Scattered tan macules - Discussed due to sun exposure - Benign, observe - Call for any changes  Seborrheic Keratoses - Stuck-on, waxy, tan-brown papules and plaques  - Discussed benign etiology and prognosis. - Observe - Call for any changes  Melanocytic Nevi - Tan-brown and/or pink-flesh-colored symmetric macules and papules - Benign appearing on exam today - Observation - Call clinic for new or changing moles - Recommend daily use of broad spectrum spf 30+ sunscreen to sun-exposed areas.   Hemangiomas - Red papules - Discussed benign nature - Observe - Call for any changes  Actinic Damage - diffuse scaly erythematous macules with underlying dyspigmentation - Recommend daily broad spectrum sunscreen SPF 30+ to sun-exposed areas, reapply every 2 hours as needed.  - Call for new or changing lesions.  Skin cancer screening performed today.   I, Dharma Pare, PA-C, have reviewed all documentation's for this visit.  The documentation on 06/01/20 for the exam, diagnosis, procedures and orders are all accurate and complete.

## 2020-06-23 ENCOUNTER — Other Ambulatory Visit: Payer: Self-pay | Admitting: Nurse Practitioner

## 2020-06-23 DIAGNOSIS — Z1231 Encounter for screening mammogram for malignant neoplasm of breast: Secondary | ICD-10-CM

## 2020-07-17 ENCOUNTER — Ambulatory Visit: Payer: 59 | Admitting: Neurology

## 2020-07-17 ENCOUNTER — Encounter: Payer: Self-pay | Admitting: Neurology

## 2020-07-17 VITALS — BP 129/78 | HR 79 | Ht 64.0 in | Wt 163.0 lb

## 2020-07-17 DIAGNOSIS — G4733 Obstructive sleep apnea (adult) (pediatric): Secondary | ICD-10-CM | POA: Diagnosis not present

## 2020-07-17 DIAGNOSIS — E663 Overweight: Secondary | ICD-10-CM

## 2020-07-17 DIAGNOSIS — Z9989 Dependence on other enabling machines and devices: Secondary | ICD-10-CM

## 2020-07-17 DIAGNOSIS — R635 Abnormal weight gain: Secondary | ICD-10-CM

## 2020-07-17 NOTE — Patient Instructions (Addendum)
  Based on your symptoms and your exam I believe you are still at risk for obstructive sleep apnea and would benefit from reevaluation as it has been many years and you need new supplies and an updated machine. Therefore, I think we should proceed with a sleep study to determine how severe your sleep apnea is. If you have more than mild OSA, I want you to consider ongoing treatment with CPAP. Please remember, the risks and ramifications of moderate to severe obstructive sleep apnea or OSA are: Cardiovascular disease, including congestive heart failure, stroke, difficult to control hypertension, arrhythmias, and even type 2 diabetes has been linked to untreated OSA. Sleep apnea causes disruption of sleep and sleep deprivation in most cases, which, in turn, can cause recurrent headaches, problems with memory, mood, concentration, focus, and vigilance. Most people with untreated sleep apnea report excessive daytime sleepiness, which can affect their ability to drive. Please do not drive if you feel sleepy.   I will likely see you back after your sleep study to go over the test results and where to go from there. We will call you after your sleep study to advise about the results (most likely, you will hear from [place], my nurse) and to set up an appointment at the time, as necessary.    Our sleep lab administrative assistant will call you to schedule your sleep study. If you don't hear back from her by about 2 weeks from now, please feel free to call her at [phone]. You can leave a message with your phone number and concerns, if you get the voicemail box. She will call back as soon as possible.

## 2020-07-17 NOTE — Progress Notes (Signed)
Subjective:    Patient ID: Diana Armstrong is a 65 y.o. female.  HPI     Star Age, MD, PhD [facility] [address], [address] [place], [place] [address]  Dear Dr. Brigitte Pulse,  I saw your patient, Diana Armstrong, upon your kind request in my Sleep clinic today for initial consultation of her sleep disorder, in particular, evaluation of her prior diagnosis of obstructive sleep apnea.  The patient is unaccompanied today.  As you know, Ms. Diana Armstrong is a 65 year old right-handed woman with an underlying medical history of reflux disease, hypothyroidism, kidney stones, hyperlipidemia, hemochromatosis, gallstones, basal cell carcinoma, migraine headaches, emphysema, smoking, and overweight state, who was previously diagnosed with obstructive sleep apnea and placed on CPAP therapy.  She had an error message on her CPAP machine and needs new equipment. Prior sleep study results are not available for my review today from 2012, but I was able to review her sleep study report from 12/02/2003.  He had [facility] in [place], [place] [facility].  Total RDI was 24/h, O2 nadir 87%.  She reports that she has been on CPAP of 8 cm.  A download was not possible.  She reports that her DME company, [employer], was not able to repair the machine and it does show readings of her download beyond 2008 for some reason.  She reports full compliance with treatment.  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  She is divorced, she lives alone, no pets in the house, does not have a TV in the bedroom.  Generally speaking, bedtime is around 9 and rise time between 530 and 6 on a day-to-day basis.  She drinks caffeine in the form of soda, 2 cans/day, no coffee typically.  She drinks alcohol socially in moderation, not daily.  She smokes about half a pack per day and is in the  process of smoking cessation.  She has a Radio producer.  She reports that her pressure has been 8 cm since 2005 and they kept the same pressure after her sleep study in 2012.  Her Epworth sleepiness score is 10 out of 24, fatigue severity score is 20 out of 63.  She reports an approximate weight gain of 10 pounds in the past year. She tried a dental device in the past which did not work out for her.  She is not interested in pursuing any other alternatives such as invasive/surgical treatment options for sleep apnea and is motivated to continue with positive airway pressure treatment.  She uses nasal pillows successfully.  She is typically up-to-date with her supplies through [place].  Her Past Medical History Is Significant For: Past Medical History:  Diagnosis Date  . Basal cell carcinoma 11/14/2017   right inner [id number]  . Gallstones   . GERD (gastroesophageal reflux disease)   . Hemochromatosis   . Hyperlipidemia   . 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 Kidney stones   . Sleep apnea    on CPAP    Her Past Surgical History Is Significant For: Past Surgical History:  Procedure Laterality Date  . BREAST BIOPSY Right 2010   Benign Stereo Biopsy  . MENISCUS DEBRIDEMENT Right 07/28/2018  . MOUTH SURGERY     roof of mouth biopsies    Her Family History Is Significant For: Family History  Problem Relation Age of Onset  . Hemochromatosis Mother   . Hypertension Mother   . Thyroid disease Mother        hypo  . Hemochromatosis Father   . Hemochromatosis Brother   . Hemochromatosis Brother   . Thyroid disease Daughter        hypo  . Colon polyps Sister   . Colon polyps Brother     Her Social History Is Significant For: Social History   Socioeconomic History  . Marital status: Divorced    Spouse name: Not on file  . Number of children: 2  . Years of education: Not on file  . Highest education level: Not on file  Occupational History  . Occupation:  Public relations account executive: [employer]  Tobacco Use  . Smoking status: Current Every Day Smoker    Packs/day: 0.50    Years: 36.00    Pack years: 18.00    Types: Cigarettes  . Smokeless tobacco: Never Used  Vaping Use  . Vaping Use: Never used  Substance and Sexual Activity  . Alcohol use: Yes    Alcohol/week: 3.0 standard drinks    Types: 3 Standard drinks or equivalent per week  . Drug use: No  . Sexual activity: Not Currently    Birth control/protection: Post-menopausal  Other Topics Concern  . Not on file  Social History Narrative  . Not on file   Social Determinants of Health   Financial Resource Strain: Not on file  Food Insecurity: Not on file  Transportation Needs: Not on file  Physical Activity: Not on file  Stress: Not on file  Social Connections: Not on file    Her Allergies Are:  No Known Allergies:   Her Current Medications Are:  Outpatient Encounter Medications as of 07/17/2020  Medication Sig  . aspirin 81 MG tablet Take 81 mg by mouth every other day.  . Biotin 5000 MCG CAPS Take by mouth daily.  . Calcium-Magnesium-Vitamin D (CALCIUM 1200+D3 PO) Take by mouth daily. Pt takes Calicum 0086 mg + 7619 mg D3 every day  . Cholecalciferol (VITAMIN D3) 1000 UNITS CAPS Take by mouth 2 (two) times daily.  . Coenzyme Q10 (COQ10) 200 MG CAPS Take by mouth daily.  Marland Kitchen esomeprazole (NEXIUM) 40 MG capsule Take 40 mg by mouth daily before breakfast.  . omega-3 acid ethyl esters (LOVAZA) 1 g capsule Take 4 capsules by mouth daily.  . RESTASIS 0.05 % ophthalmic emulsion 1 drop 2 (two) times daily.  . rosuvastatin (CRESTOR) 10 MG tablet   . SYNTHROID 112 MCG tablet   . tretinoin (RETIN-A) 0.1 % cream Apply topically as needed.   . TURMERIC PO Take by mouth daily.  Marland Kitchen zolpidem (AMBIEN) 5 MG tablet Take 5 mg by mouth as needed.    No facility-administered encounter medications on file as of 07/17/2020.  :   Review of Systems:  Out of a complete 14  point review of systems, all are reviewed and negative with the exception of these symptoms as listed below:  Review of Systems  Neurological:       Here for sleep consult. Prior sleep study, most recent was 2012. Pt was started on CPAP in 2005, pt sts her current machine is not uploading to her SD card.   Epworth Sleepiness Scale 0= would never doze 1= slight chance of dozing 2= moderate chance of dozing 3= high chance of dozing  Sitting and reading: 2 Watching TV:2 Sitting inactive in  a public place (ex. Theater or meeting):0 As a passenger in a car for an hour without a break:2 Lying down to rest in the afternoon:2 Sitting and talking to someone:0 Sitting quietly after lunch (no alcohol):2 In a car, while stopped in traffic:0 Total:10     Objective:  Neurological Exam  Physical Exam Physical Examination:   Vitals:   07/17/20 0839  BP: 129/78  Pulse: 79    General Examination: The patient is a very pleasant 65 y.o. female in no acute distress. She appears well-developed and well-nourished and well groomed.   HEENT: Normocephalic, atraumatic, pupils are equal, round and reactive to light, extraocular tracking is good without limitation to gaze excursion or nystagmus noted. Hearing is grossly intact. Face is symmetric with normal facial animation. Speech is clear with no dysarthria noted. There is no hypophonia. There is no lip, neck/head, jaw or voice tremor. Neck is supple with full range of passive and active motion. There are no carotid bruits on auscultation. Oropharynx exam reveals: mild mouth dryness, adequate dental hygiene and moderate airway crowding, she has a redundant soft palate, Mallampati is class IV and tonsils and uvula not fully visualized.  Neck circumference of 14-1/2 inches.  Tongue protrudes centrally.  Chest: Clear to auscultation without wheezing, rhonchi or crackles noted.  Heart: S1+S2+0, regular and normal without murmurs, rubs or gallops noted.    Abdomen: Soft, non-tender and non-distended.  Extremities: There is no obvious edema in the distal lower extremities bilaterally.    Skin: Warm and dry without trophic changes noted.   Musculoskeletal: exam reveals no obvious joint deformities.   Neurologically:  Mental status: The patient is awake, alert and oriented in all 4 spheres. Her immediate and remote memory, attention, language skills and fund of knowledge are appropriate. There is no evidence of aphasia, agnosia, apraxia or anomia. Speech is clear with normal prosody and enunciation. Thought process is linear. Mood is normal and affect is normal.  Cranial nerves II - XII are as described above under HEENT exam.  Motor exam: Normal bulk, strength and tone is noted. There is no tremor. Fine motor skills and coordination: grossly intact.  Cerebellar testing: No dysmetria or intention tremor. There is no truncal or gait ataxia.  Gait, station and balance: she stands easily. No veering to one side is noted. No leaning to one side is noted. Posture is age-appropriate and stance is narrow based. Gait shows normal stride length and normal pace. No problems turning are noted.  Assessment and Plan:  In summary, DONTASIA MIRANDA is a very pleasant 65 y.o.-year old female with an underlying medical history of reflux disease, hypothyroidism, kidney stones, hyperlipidemia, hemochromatosis, gallstones, basal cell carcinoma, migraine headaches, emphysema, smoking, and overweight state, who presents for evaluation of her obstructive sleep apnea.  She was originally diagnosed with sleep apnea in 2005.  She had moderate sleep apnea then.  She does report more recent weight gain and she has trouble with her machine not registering her usage, a download report is not available after 2008 as I understand.  Her DME company, [employer], tried using a new SIM card in it and it did not result in any further data being registered.  Currently her machine works for  her.  She uses nasal pillows and is motivated to continue with positive airway pressure.  She would benefit from reevaluation with a home sleep test.  She is reluctant given her tight work schedule and travel schedule but is agreeable to pursuing home  sleep testing.  I advised her that her sleep study data is over 23 years old and she had some weight changes well.  Sleep apnea may have stayed the same or may have become a little worse.  We will check for an updated diagnosis with a home sleep test and call her with the results.  We will plan a follow-up afterwards.  Based on her home sleep test results I will write for new equipment and we can send the order to her current DME company or a different DME company if she so chooses.  She is currently not interested in pursuing any other treatment options for sleep apnea, in fact, she did try an oral appliance some years ago which did not work out for her.  We talked about the importance of healthy lifestyle and smoking cessation.  She does try to keep good sleep hygiene.  She is advised to follow-up after testing.  I answered all her questions today and she was in agreement.  She requested her AVS to be accessible online and did not need a paper copy. Thank you very much for allowing me to participate in the care of this nice patient. If I can be of any further assistance to you please do not hesitate to call me at [phone].  Sincerely,   Star Age, MD, PhD

## 2020-08-08 ENCOUNTER — Other Ambulatory Visit: Payer: Self-pay

## 2020-08-08 ENCOUNTER — Ambulatory Visit (HOSPITAL_COMMUNITY)
Admission: RE | Admit: 2020-08-08 | Discharge: 2020-08-08 | Disposition: A | Discharge status: Home / Self Care | Payer: PRIVATE HEALTH INSURANCE | Source: Ambulatory Visit | Attending: Internal Medicine | Admitting: Internal Medicine

## 2020-08-08 ENCOUNTER — Other Ambulatory Visit (HOSPITAL_COMMUNITY): Payer: Self-pay | Admitting: Internal Medicine

## 2020-08-08 DIAGNOSIS — G453 Amaurosis fugax: Secondary | ICD-10-CM | POA: Diagnosis not present

## 2020-08-14 ENCOUNTER — Ambulatory Visit (INDEPENDENT_AMBULATORY_CARE_PROVIDER_SITE_OTHER): Payer: 59 | Admitting: Neurology

## 2020-08-14 DIAGNOSIS — R635 Abnormal weight gain: Secondary | ICD-10-CM

## 2020-08-14 DIAGNOSIS — E663 Overweight: Secondary | ICD-10-CM

## 2020-08-14 DIAGNOSIS — G4733 Obstructive sleep apnea (adult) (pediatric): Secondary | ICD-10-CM

## 2020-08-15 NOTE — Progress Notes (Signed)
See procedure note.

## 2020-08-17 ENCOUNTER — Telehealth: Payer: Self-pay

## 2020-08-17 NOTE — Addendum Note (Signed)
Addended by: Star Age on: 08/17/2020 02:34 PM   Modules accepted: Orders

## 2020-08-17 NOTE — Telephone Encounter (Signed)
I called pt. I advised pt that Dr. Rexene Alberts reviewed their sleep study results and found that pt has severe. Dr. Rexene Alberts recommends that pt start on auto pap for treatment. I reviewed PAP compliance expectations with the pt. Pt is agreeable to starting an auto-PAP. I advised pt that an order will be sent to a DME, [facility], and [employer] will call the pt within about one week after they file with the pt's insurance. [employer] will show the pt how to use the machine, fit for masks, and troubleshoot the auto-PAP if needed. Pt will CB once she has a set up date and we will scheduled 31- 90 day f/u. Pt understands this appt is for insurance compliance and that she will need to use her new auto pap for at least 4 hours every night. and I have sent the order to [facility] and have received confirmation that they have received the order.

## 2020-08-17 NOTE — Telephone Encounter (Signed)
-----   Message from Star Age, MD sent at 08/17/2020  2:33 PM EDT ----- Patient was referred by her primary care physician for reevaluation of her sleep apnea.  She had testing several years ago and had an error message on her current machine.  She had a home sleep test on 08/14/2020 which confirmed severe obstructive sleep apnea.  Please advise patient that I would like to prescribe an AutoPap machine.  We will send the order to her DME company or a new DME company if she is so wishes.  We have to see her back in follow-up after she starts treatment with her new equipment within 31 to 89 days typically.  Until she gets her new machine, she is encouraged to continue with her old CPAP machine.  Please reinforce importance of maintaining compliance on treatment.

## 2020-08-17 NOTE — Procedures (Signed)
   [facility] ([facility]) REPORT  STUDY DATE: 08-14-20  DOB: 04-Jan-1956  MRN: [id number]  ORDERING CLINICIAN: Star Age, MD, PhD   REFERRING CLINICIAN: Ginger Organ., MD   CLINICAL INFORMATION/HISTORY: 65 year old woman with a history of reflux disease, hypothyroidism, kidney stones, hyperlipidemia, hemochromatosis, gallstones, basal cell carcinoma, migraine headaches, emphysema, smoking, and overweight state, who was previously diagnosed with obstructive sleep apnea and placed on CPAP therapy. She presents for reevaluation.  Epworth sleepiness score: 10/24.  BMI: 27.9 kg/m  Neck Circumference: 14.5"  FINDINGS:   Sleep Summary:   Total Recording Time (hours, min): 7 h 56 min  Total Sleep Time (hours, min):  6 h 31 min   Percent REM (%):    17.62 %  Respiratory Indices:   Calculated pAHI (per hour):  37.1/hour        REM pAHI:    70.0/hour      NREM pAHI: 31.2/hour Supine AHI: 36.2/ hour  Oxygen Saturation Statistics:    Oxygen Saturation (%) Mean:  92%  Minimum oxygen saturation (%):       79%  O2 Saturation Range (%): 79 - 97%   O2 Saturation (minutes) <=88%:2.0 min  Pulse Rate Statistics:   Pulse Mean (bpm):   59/min    Pulse Range (43-93 min)   IMPRESSION: OSA (obstructive sleep apnea), severe  RECOMMENDATION:  This home sleep test demonstrates severe  obstructive sleep apnea with a total AHI of 37.1/hour and O2 nadir of 79%.  Mild to moderate and at times severe snoring was detected.  Ongoing treatment with positive airway pressure is recommended. The patient will be advised to proceed with an autoPAP titration/trial at home for now. A full night titration study may be considered to optimize treatment settings, if needed down the road. Please note that untreated obstructive sleep apnea may carry additional perioperative morbidity. Patients with significant obstructive sleep apnea should receive perioperative PAP therapy  and the surgeons and particularly the anesthesiologist should be informed of the diagnosis and the severity of the sleep disordered breathing. The patient should be cautioned not to drive, work at heights, or operate dangerous or heavy equipment when tired or sleepy. Review and reiteration of good sleep hygiene measures should be pursued with any patient. Other causes of the patient's symptoms, including circadian rhythm disturbances, an underlying mood disorder, medication effect and/or an underlying medical problem cannot be ruled out based on this test. Clinical correlation is recommended. The patient and her referring provider will be notified of the test results. The patient will be seen in follow up in sleep clinic at [facility].  I certify that I have reviewed the raw data recording prior to the issuance of this report in accordance with the standards of the American Academy of Sleep Medicine (AASM).   INTERPRETING PHYSICIAN:   Star Age, MD, PhD  Board Certified in Neurology and Sleep Medicine  [facility] [address], [place], [place] [address] [phone]

## 2020-08-18 ENCOUNTER — Ambulatory Visit
Admission: RE | Admit: 2020-08-18 | Discharge: 2020-08-18 | Disposition: A | Discharge status: Home / Self Care | Payer: PRIVATE HEALTH INSURANCE | Source: Ambulatory Visit | Attending: Nurse Practitioner | Admitting: Nurse Practitioner

## 2020-08-18 ENCOUNTER — Other Ambulatory Visit: Payer: Self-pay | Admitting: Internal Medicine

## 2020-08-18 ENCOUNTER — Other Ambulatory Visit: Payer: Self-pay

## 2020-08-18 DIAGNOSIS — Z1231 Encounter for screening mammogram for malignant neoplasm of breast: Secondary | ICD-10-CM

## 2020-08-29 ENCOUNTER — Other Ambulatory Visit: Payer: Self-pay | Admitting: Internal Medicine

## 2020-08-29 DIAGNOSIS — F17200 Nicotine dependence, unspecified, uncomplicated: Secondary | ICD-10-CM

## 2020-08-29 NOTE — Telephone Encounter (Signed)
Pt called, would like to discuss new CPAP and recent sleep study. Would like a call from the nurse.

## 2020-08-30 NOTE — Telephone Encounter (Signed)
I called pt and she stated that she call [facility] (local [place] office).  They have not received order for the cpap.  She gave me the fax [phone]. I tried to call them but 0830 is when they are open.  I faxed received fax confirmation at that #, but when spoke to [facility], she said needed to go to the national #, and I redid this am.  I spoke to [facility] and they did receive this.  I relayed this to pt.

## 2020-09-29 ENCOUNTER — Ambulatory Visit: Payer: 59 | Admitting: Nurse Practitioner

## 2020-09-29 ENCOUNTER — Ambulatory Visit
Admission: RE | Admit: 2020-09-29 | Discharge: 2020-09-29 | Disposition: A | Discharge status: Home / Self Care | Payer: PRIVATE HEALTH INSURANCE | Source: Ambulatory Visit | Attending: Internal Medicine | Admitting: Internal Medicine

## 2020-09-29 ENCOUNTER — Other Ambulatory Visit: Payer: Self-pay

## 2020-09-29 ENCOUNTER — Ambulatory Visit: Payer: 59

## 2020-09-29 ENCOUNTER — Ambulatory Visit: Payer: PRIVATE HEALTH INSURANCE

## 2020-09-29 DIAGNOSIS — F17200 Nicotine dependence, unspecified, uncomplicated: Secondary | ICD-10-CM

## 2020-10-06 ENCOUNTER — Other Ambulatory Visit: Payer: Self-pay

## 2020-10-06 ENCOUNTER — Ambulatory Visit (INDEPENDENT_AMBULATORY_CARE_PROVIDER_SITE_OTHER): Payer: 59 | Admitting: Obstetrics & Gynecology

## 2020-10-06 ENCOUNTER — Encounter: Payer: Self-pay | Admitting: Obstetrics & Gynecology

## 2020-10-06 VITALS — BP 120/74 | HR 64 | Resp 16 | Ht 63.25 in | Wt 165.0 lb

## 2020-10-06 DIAGNOSIS — M8589 Other specified disorders of bone density and structure, multiple sites: Secondary | ICD-10-CM | POA: Diagnosis not present

## 2020-10-06 DIAGNOSIS — Z780 Asymptomatic menopausal state: Secondary | ICD-10-CM | POA: Diagnosis not present

## 2020-10-06 DIAGNOSIS — Z01419 Encounter for gynecological examination (general) (routine) without abnormal findings: Secondary | ICD-10-CM | POA: Diagnosis not present

## 2020-10-06 NOTE — Progress Notes (Signed)
Diana Armstrong November 14, 1955 OK:[id number]   History:    65 y.o. G2P2L2 Divorced.  Has a grand-daughter  RP: Established patient presenting for annual gyn exam   HPI: Postmenopausal, well on no hormone replacement therapy.  No postmenopausal bleeding.  No pelvic pain.  Abstinent.  Breasts normal.  Urine and bowel movements normal.  Body mass index 29.  Good fitness with elliptical and light weights.  Health labs with Dr. Brigitte Pulse.  Colonoscopy 2021.  Bone density osteopenia in 2020.  Past medical history,surgical history, family history and social history were all reviewed and documented in the EPIC chart.  Gynecologic History Patient's last menstrual period was 05/26/2004.  Obstetric History OB History  Gravida Para Term Preterm AB Living  '2 2 2     2  '$ SAB IAB Ectopic Multiple Live Births          2    # Outcome Date GA Lbr Len/2nd Weight Sex Delivery Anes PTL Lv  2 Term 04/1985 [redacted]w[redacted]d 6 lb 8 oz (2.948 kg) F Vag-Spont   LIV  1 Term 11/1982 [id number] lb 4 oz (2.835 kg) F Vag-Spont   LIV     ROS: A ROS was performed and pertinent positives and negatives are included in the history.  GENERAL: No fevers or chills. HEENT: No change in vision, no earache, sore throat or sinus congestion. NECK: No pain or stiffness. CARDIOVASCULAR: No chest pain or pressure. No palpitations. PULMONARY: No shortness of breath, cough or wheeze. GASTROINTESTINAL: No abdominal pain, nausea, vomiting or diarrhea, melena or bright red blood per rectum. GENITOURINARY: No urinary frequency, urgency, hesitancy or dysuria. MUSCULOSKELETAL: No joint or muscle pain, no back pain, no recent trauma. DERMATOLOGIC: No rash, no itching, no lesions. ENDOCRINE: No polyuria, polydipsia, no heat or cold intolerance. No recent change in weight. HEMATOLOGICAL: No anemia or easy bruising or bleeding. NEUROLOGIC: No headache, seizures, numbness, tingling or weakness. PSYCHIATRIC: No depression, no loss of interest in normal activity or  change in sleep pattern.     Exam:   BP 120/74   Pulse 64   Resp 16   Ht 5' 3.25" (1.607 m)   Wt 165 lb (74.8 kg)   LMP 05/26/2004   BMI 29.00 kg/m   Body mass index is 29 kg/m.  General appearance : Well developed well nourished female. No acute distress HEENT: Eyes: no retinal hemorrhage or exudates,  Neck supple, trachea midline, no carotid bruits, no thyroidmegaly Lungs: Clear to auscultation, no rhonchi or wheezes, or rib retractions  Heart: Regular rate and rhythm, no murmurs or gallops Breast:Examined in sitting and supine position were symmetrical in appearance, no palpable masses or tenderness,  no skin retraction, no nipple inversion, no nipple discharge, no skin discoloration, no axillary or supraclavicular lymphadenopathy Abdomen: no palpable masses or tenderness, no rebound or guarding Extremities: no edema or skin discoloration or tenderness  Pelvic: Vulva: Normal             Vagina: No gross lesions or discharge  Cervix: No gross lesions or discharge  Uterus  AV, normal size, shape and consistency, non-tender and mobile  Adnexa  Without masses or tenderness  Anus: Normal   Assessment/Plan:  [age].o. female for annual exam   1. Well woman exam with routine gynecological exam Normal gynecologic exam in menopause.  Pap test negative in 2021, no indication for repeat Pap test at this time.  Breast exam normal.  Screening mammogram June 2022 was negative.  Colonoscopy 2021.  Health labs with Dr. Brigitte Pulse.  2. Postmenopause Well on no hormone replacement therapy.  No postmenopausal bleeding.  3. Osteopenia of multiple sites H/O Osteoporosis treated with Reclast in the past.  Last BD Osteopenia in 2020.  Will repeat a BD with Dr Brigitte Pulse.  Continue Vit D supplements, Ca++ 1.5 g/d total and regular weight bearing physical activity.  Other orders - nitroGLYCERIN (NITRODUR - DOSED IN MG/24 HR) 0.2 mg/hr patch; 0.2 mg daily. - COLLAGEN PO; Take by mouth. - UNABLE TO FIND;  Med Name: fiber therapy tablet   Princess Bruins MD, 9:23 AM 10/06/2020

## 2021-06-05 ENCOUNTER — Encounter: Payer: Self-pay | Admitting: Physician Assistant

## 2021-06-05 ENCOUNTER — Ambulatory Visit (INDEPENDENT_AMBULATORY_CARE_PROVIDER_SITE_OTHER): Payer: PRIVATE HEALTH INSURANCE | Admitting: Physician Assistant

## 2021-06-05 DIAGNOSIS — Z85828 Personal history of other malignant neoplasm of skin: Secondary | ICD-10-CM | POA: Diagnosis not present

## 2021-06-05 DIAGNOSIS — L570 Actinic keratosis: Secondary | ICD-10-CM | POA: Diagnosis not present

## 2021-06-05 DIAGNOSIS — Z1283 Encounter for screening for malignant neoplasm of skin: Secondary | ICD-10-CM

## 2021-06-05 DIAGNOSIS — D485 Neoplasm of uncertain behavior of skin: Secondary | ICD-10-CM | POA: Diagnosis not present

## 2021-06-05 MED ORDER — FLUOROURACIL 5 % EX CREA: TOPICAL_CREAM | CUTANEOUS | 0 refills | Status: DC

## 2021-06-05 NOTE — Patient Instructions (Signed)

## 2021-06-05 NOTE — Progress Notes (Signed)
? ?  Follow-Up Visit ?  ?Subjective  ?Diana Armstrong is a 66 y.o. female who presents for the following: Annual Exam ( No new concerns- have spots I want to  ask her about - I have list- NO personal or family history of melanoma. Personal history of non mole skin cancers. ). ? ? ?The following portions of the chart were reviewed this encounter and updated as appropriate:  Tobacco  Allergies  Meds  Problems  Med Hx  Surg Hx  Fam Hx   ?  ? ?Objective  ?Well appearing patient in no apparent distress; mood and affect are within normal limits. ? ?A full examination was performed including scalp, head, eyes, ears, nose, lips, neck, chest, axillae, abdomen, back, buttocks, bilateral upper extremities, bilateral lower extremities, hands, feet, fingers, toes, fingernails, and toenails. All findings within normal limits unless otherwise noted below. ? ?No atypical nevi No signs of non-mole skin cancer.  ? ?Left Upper Arm - Posterior ?Dark crust on an erythematous base ? ?Right Thigh - Posterior ?White crusted papule ? ?Left Zygomatic Area (2), Right Zygomatic Area (3) ?Erythematous patches with gritty scale. ? ? ?Assessment & Plan  ?Encounter for screening for malignant neoplasm of skin ? ?Yearly skin check ? ?Neoplasm of uncertain behavior of skin (2) ?Left Upper Arm - Posterior ? ?Skin / nail biopsy ?Type of biopsy: tangential   ?Informed consent: discussed and consent obtained   ?Timeout: patient name, date of birth, surgical site, and procedure verified   ?Procedure prep:  Patient was prepped and draped in usual sterile fashion (Non sterile) ?Prep type:  Chlorhexidine ?Anesthesia: the lesion was anesthetized in a standard fashion   ?Anesthetic:  1% lidocaine w/ epinephrine 1-100,000 local infiltration ?Instrument used: flexible razor blade   ?Outcome: patient tolerated procedure well   ?Post-procedure details: wound care instructions given   ? ?Specimen 1 - Surgical pathology ?Differential Diagnosis: r/o  isk ? ?Check Margins: No ? ?Right Thigh - Posterior ? ?Skin / nail biopsy ?Type of biopsy: tangential   ?Informed consent: discussed and consent obtained   ?Timeout: patient name, date of birth, surgical site, and procedure verified   ?Procedure prep:  Patient was prepped and draped in usual sterile fashion (Non sterile) ?Prep type:  Chlorhexidine ?Anesthesia: the lesion was anesthetized in a standard fashion   ?Anesthetic:  1% lidocaine w/ epinephrine 1-100,000 local infiltration ?Instrument used: flexible razor blade   ?Outcome: patient tolerated procedure well   ?Post-procedure details: wound care instructions given   ? ?Specimen 2 - Surgical pathology ?Differential Diagnosis: r/o isk ? ?Check Margins: No ? ?AK (actinic keratosis) (5) ?Left Zygomatic Area (2); Right Zygomatic Area (3) ? ?Destruction of lesion - Left Zygomatic Area, Right Zygomatic Area ?Complexity: simple   ?Destruction method: cryotherapy   ?Informed consent: discussed and consent obtained   ?Timeout:  patient name, date of birth, surgical site, and procedure verified ?Lesion destroyed using liquid nitrogen: Yes   ?Outcome: patient tolerated procedure well with no complications   ? ?fluorouracil (EFUDEX) 5 % cream - Left Zygomatic Area, Right Zygomatic Area ?Apply to affected area qhs Monday - Sunday x 2 weeks. ? ? ? ?I, Toniann Dickerson, PA-C, have reviewed all documentation's for this visit.  The documentation on 06/05/21 for the exam, diagnosis, procedures and orders are all accurate and complete. ?

## 2021-06-11 DIAGNOSIS — M67431 Ganglion, right wrist: Secondary | ICD-10-CM | POA: Insufficient documentation

## 2021-06-11 DIAGNOSIS — M1812 Unilateral primary osteoarthritis of first carpometacarpal joint, left hand: Secondary | ICD-10-CM | POA: Insufficient documentation

## 2021-07-02 DIAGNOSIS — G4733 Obstructive sleep apnea (adult) (pediatric): Secondary | ICD-10-CM | POA: Diagnosis not present

## 2021-07-06 ENCOUNTER — Other Ambulatory Visit: Payer: Self-pay | Admitting: Obstetrics & Gynecology

## 2021-07-06 DIAGNOSIS — Z1231 Encounter for screening mammogram for malignant neoplasm of breast: Secondary | ICD-10-CM

## 2021-08-02 DIAGNOSIS — G4733 Obstructive sleep apnea (adult) (pediatric): Secondary | ICD-10-CM | POA: Diagnosis not present

## 2021-08-10 DIAGNOSIS — H52223 Regular astigmatism, bilateral: Secondary | ICD-10-CM | POA: Diagnosis not present

## 2021-08-10 DIAGNOSIS — H2513 Age-related nuclear cataract, bilateral: Secondary | ICD-10-CM | POA: Diagnosis not present

## 2021-08-10 DIAGNOSIS — H11153 Pinguecula, bilateral: Secondary | ICD-10-CM | POA: Diagnosis not present

## 2021-08-10 DIAGNOSIS — H16223 Keratoconjunctivitis sicca, not specified as Sjogren's, bilateral: Secondary | ICD-10-CM | POA: Diagnosis not present

## 2021-08-10 DIAGNOSIS — H43813 Vitreous degeneration, bilateral: Secondary | ICD-10-CM | POA: Diagnosis not present

## 2021-08-10 DIAGNOSIS — H35373 Puckering of macula, bilateral: Secondary | ICD-10-CM | POA: Diagnosis not present

## 2021-08-20 ENCOUNTER — Ambulatory Visit
Admission: RE | Admit: 2021-08-20 | Discharge: 2021-08-20 | Disposition: A | Discharge status: Home / Self Care | Payer: PPO | Source: Ambulatory Visit | Attending: Obstetrics & Gynecology | Admitting: Obstetrics & Gynecology

## 2021-08-20 DIAGNOSIS — Z1231 Encounter for screening mammogram for malignant neoplasm of breast: Secondary | ICD-10-CM | POA: Diagnosis not present

## 2021-08-29 DIAGNOSIS — G4733 Obstructive sleep apnea (adult) (pediatric): Secondary | ICD-10-CM | POA: Diagnosis not present

## 2021-09-01 DIAGNOSIS — G4733 Obstructive sleep apnea (adult) (pediatric): Secondary | ICD-10-CM | POA: Diagnosis not present

## 2021-09-14 DIAGNOSIS — E039 Hypothyroidism, unspecified: Secondary | ICD-10-CM | POA: Diagnosis not present

## 2021-09-14 DIAGNOSIS — R7301 Impaired fasting glucose: Secondary | ICD-10-CM | POA: Diagnosis not present

## 2021-09-14 DIAGNOSIS — E785 Hyperlipidemia, unspecified: Secondary | ICD-10-CM | POA: Diagnosis not present

## 2021-09-14 DIAGNOSIS — R7989 Other specified abnormal findings of blood chemistry: Secondary | ICD-10-CM | POA: Diagnosis not present

## 2021-09-14 DIAGNOSIS — M810 Age-related osteoporosis without current pathological fracture: Secondary | ICD-10-CM | POA: Diagnosis not present

## 2021-09-19 DIAGNOSIS — M67442 Ganglion, left hand: Secondary | ICD-10-CM | POA: Diagnosis not present

## 2021-09-19 DIAGNOSIS — M13842 Other specified arthritis, left hand: Secondary | ICD-10-CM | POA: Diagnosis not present

## 2021-09-19 DIAGNOSIS — M79642 Pain in left hand: Secondary | ICD-10-CM | POA: Diagnosis not present

## 2021-09-19 DIAGNOSIS — M25532 Pain in left wrist: Secondary | ICD-10-CM | POA: Diagnosis not present

## 2021-09-24 DIAGNOSIS — Z1331 Encounter for screening for depression: Secondary | ICD-10-CM | POA: Diagnosis not present

## 2021-09-24 DIAGNOSIS — M810 Age-related osteoporosis without current pathological fracture: Secondary | ICD-10-CM | POA: Diagnosis not present

## 2021-09-24 DIAGNOSIS — E039 Hypothyroidism, unspecified: Secondary | ICD-10-CM | POA: Diagnosis not present

## 2021-09-24 DIAGNOSIS — G5700 Lesion of sciatic nerve, unspecified lower limb: Secondary | ICD-10-CM | POA: Diagnosis not present

## 2021-09-24 DIAGNOSIS — R82998 Other abnormal findings in urine: Secondary | ICD-10-CM | POA: Diagnosis not present

## 2021-09-24 DIAGNOSIS — I2510 Atherosclerotic heart disease of native coronary artery without angina pectoris: Secondary | ICD-10-CM | POA: Diagnosis not present

## 2021-09-24 DIAGNOSIS — R7301 Impaired fasting glucose: Secondary | ICD-10-CM | POA: Diagnosis not present

## 2021-09-24 DIAGNOSIS — J439 Emphysema, unspecified: Secondary | ICD-10-CM | POA: Diagnosis not present

## 2021-09-24 DIAGNOSIS — F17200 Nicotine dependence, unspecified, uncomplicated: Secondary | ICD-10-CM | POA: Diagnosis not present

## 2021-09-24 DIAGNOSIS — E785 Hyperlipidemia, unspecified: Secondary | ICD-10-CM | POA: Diagnosis not present

## 2021-09-24 DIAGNOSIS — Z0000 Encounter for general adult medical examination without abnormal findings: Secondary | ICD-10-CM | POA: Diagnosis not present

## 2021-09-24 DIAGNOSIS — R030 Elevated blood-pressure reading, without diagnosis of hypertension: Secondary | ICD-10-CM | POA: Diagnosis not present

## 2021-09-24 DIAGNOSIS — I700 Atherosclerosis of aorta: Secondary | ICD-10-CM | POA: Diagnosis not present

## 2021-09-24 DIAGNOSIS — Z8669 Personal history of other diseases of the nervous system and sense organs: Secondary | ICD-10-CM | POA: Diagnosis not present

## 2021-09-24 DIAGNOSIS — Z1339 Encounter for screening examination for other mental health and behavioral disorders: Secondary | ICD-10-CM | POA: Diagnosis not present

## 2021-09-25 ENCOUNTER — Other Ambulatory Visit: Payer: Self-pay | Admitting: Internal Medicine

## 2021-09-25 DIAGNOSIS — F17200 Nicotine dependence, unspecified, uncomplicated: Secondary | ICD-10-CM

## 2021-10-01 DIAGNOSIS — G4733 Obstructive sleep apnea (adult) (pediatric): Secondary | ICD-10-CM | POA: Diagnosis not present

## 2021-10-02 DIAGNOSIS — G4733 Obstructive sleep apnea (adult) (pediatric): Secondary | ICD-10-CM | POA: Diagnosis not present

## 2021-10-12 ENCOUNTER — Ambulatory Visit: Payer: 59 | Admitting: Obstetrics & Gynecology

## 2021-10-18 DIAGNOSIS — H02413 Mechanical ptosis of bilateral eyelids: Secondary | ICD-10-CM | POA: Diagnosis not present

## 2021-10-18 DIAGNOSIS — H57813 Brow ptosis, bilateral: Secondary | ICD-10-CM | POA: Diagnosis not present

## 2021-10-18 DIAGNOSIS — H02831 Dermatochalasis of right upper eyelid: Secondary | ICD-10-CM | POA: Diagnosis not present

## 2021-10-18 DIAGNOSIS — H0279 Other degenerative disorders of eyelid and periocular area: Secondary | ICD-10-CM | POA: Diagnosis not present

## 2021-10-18 DIAGNOSIS — H53483 Generalized contraction of visual field, bilateral: Secondary | ICD-10-CM | POA: Diagnosis not present

## 2021-10-18 DIAGNOSIS — H02423 Myogenic ptosis of bilateral eyelids: Secondary | ICD-10-CM | POA: Diagnosis not present

## 2021-10-18 DIAGNOSIS — H02834 Dermatochalasis of left upper eyelid: Secondary | ICD-10-CM | POA: Diagnosis not present

## 2021-10-19 ENCOUNTER — Ambulatory Visit
Admission: RE | Admit: 2021-10-19 | Discharge: 2021-10-19 | Disposition: A | Discharge status: Home / Self Care | Payer: PPO | Source: Ambulatory Visit | Attending: Internal Medicine | Admitting: Internal Medicine

## 2021-10-19 DIAGNOSIS — F17200 Nicotine dependence, unspecified, uncomplicated: Secondary | ICD-10-CM

## 2021-10-19 DIAGNOSIS — F17210 Nicotine dependence, cigarettes, uncomplicated: Secondary | ICD-10-CM | POA: Diagnosis not present

## 2021-10-31 DIAGNOSIS — H53481 Generalized contraction of visual field, right eye: Secondary | ICD-10-CM | POA: Diagnosis not present

## 2021-10-31 DIAGNOSIS — G4733 Obstructive sleep apnea (adult) (pediatric): Secondary | ICD-10-CM | POA: Diagnosis not present

## 2021-11-02 DIAGNOSIS — G4733 Obstructive sleep apnea (adult) (pediatric): Secondary | ICD-10-CM | POA: Diagnosis not present

## 2021-11-30 DIAGNOSIS — G4733 Obstructive sleep apnea (adult) (pediatric): Secondary | ICD-10-CM | POA: Diagnosis not present

## 2021-12-02 DIAGNOSIS — G4733 Obstructive sleep apnea (adult) (pediatric): Secondary | ICD-10-CM | POA: Diagnosis not present

## 2021-12-04 DIAGNOSIS — H02421 Myogenic ptosis of right eyelid: Secondary | ICD-10-CM | POA: Diagnosis not present

## 2021-12-04 DIAGNOSIS — H02831 Dermatochalasis of right upper eyelid: Secondary | ICD-10-CM | POA: Diagnosis not present

## 2021-12-04 DIAGNOSIS — H02834 Dermatochalasis of left upper eyelid: Secondary | ICD-10-CM | POA: Diagnosis not present

## 2021-12-04 DIAGNOSIS — H57813 Brow ptosis, bilateral: Secondary | ICD-10-CM | POA: Diagnosis not present

## 2021-12-04 DIAGNOSIS — H53483 Generalized contraction of visual field, bilateral: Secondary | ICD-10-CM | POA: Diagnosis not present

## 2021-12-04 DIAGNOSIS — H0279 Other degenerative disorders of eyelid and periocular area: Secondary | ICD-10-CM | POA: Diagnosis not present

## 2021-12-04 DIAGNOSIS — H02413 Mechanical ptosis of bilateral eyelids: Secondary | ICD-10-CM | POA: Diagnosis not present

## 2021-12-04 DIAGNOSIS — H02423 Myogenic ptosis of bilateral eyelids: Secondary | ICD-10-CM | POA: Diagnosis not present

## 2021-12-13 DIAGNOSIS — M25561 Pain in right knee: Secondary | ICD-10-CM | POA: Diagnosis not present

## 2021-12-21 DIAGNOSIS — E039 Hypothyroidism, unspecified: Secondary | ICD-10-CM | POA: Diagnosis not present

## 2021-12-21 DIAGNOSIS — R7989 Other specified abnormal findings of blood chemistry: Secondary | ICD-10-CM | POA: Diagnosis not present

## 2021-12-21 DIAGNOSIS — D649 Anemia, unspecified: Secondary | ICD-10-CM | POA: Diagnosis not present

## 2021-12-21 DIAGNOSIS — E785 Hyperlipidemia, unspecified: Secondary | ICD-10-CM | POA: Diagnosis not present

## 2021-12-31 DIAGNOSIS — G4733 Obstructive sleep apnea (adult) (pediatric): Secondary | ICD-10-CM | POA: Diagnosis not present

## 2022-01-02 DIAGNOSIS — G4733 Obstructive sleep apnea (adult) (pediatric): Secondary | ICD-10-CM | POA: Diagnosis not present

## 2022-01-09 DIAGNOSIS — L309 Dermatitis, unspecified: Secondary | ICD-10-CM | POA: Diagnosis not present

## 2022-01-09 DIAGNOSIS — L7211 Pilar cyst: Secondary | ICD-10-CM | POA: Diagnosis not present

## 2022-01-09 DIAGNOSIS — L821 Other seborrheic keratosis: Secondary | ICD-10-CM | POA: Diagnosis not present

## 2022-01-30 DIAGNOSIS — G4733 Obstructive sleep apnea (adult) (pediatric): Secondary | ICD-10-CM | POA: Diagnosis not present

## 2022-02-01 DIAGNOSIS — G4733 Obstructive sleep apnea (adult) (pediatric): Secondary | ICD-10-CM | POA: Diagnosis not present

## 2022-02-11 DIAGNOSIS — L570 Actinic keratosis: Secondary | ICD-10-CM | POA: Diagnosis not present

## 2022-02-20 DIAGNOSIS — R0981 Nasal congestion: Secondary | ICD-10-CM | POA: Diagnosis not present

## 2022-02-20 DIAGNOSIS — F17200 Nicotine dependence, unspecified, uncomplicated: Secondary | ICD-10-CM | POA: Diagnosis not present

## 2022-02-20 DIAGNOSIS — J0100 Acute maxillary sinusitis, unspecified: Secondary | ICD-10-CM | POA: Diagnosis not present

## 2022-02-20 DIAGNOSIS — J439 Emphysema, unspecified: Secondary | ICD-10-CM | POA: Diagnosis not present

## 2022-03-01 DIAGNOSIS — G4733 Obstructive sleep apnea (adult) (pediatric): Secondary | ICD-10-CM | POA: Diagnosis not present

## 2022-03-04 DIAGNOSIS — G4733 Obstructive sleep apnea (adult) (pediatric): Secondary | ICD-10-CM | POA: Diagnosis not present

## 2022-03-22 DIAGNOSIS — E785 Hyperlipidemia, unspecified: Secondary | ICD-10-CM | POA: Diagnosis not present

## 2022-03-22 DIAGNOSIS — E039 Hypothyroidism, unspecified: Secondary | ICD-10-CM | POA: Diagnosis not present

## 2022-03-22 DIAGNOSIS — R7989 Other specified abnormal findings of blood chemistry: Secondary | ICD-10-CM | POA: Diagnosis not present

## 2022-03-22 DIAGNOSIS — D649 Anemia, unspecified: Secondary | ICD-10-CM | POA: Diagnosis not present

## 2022-04-01 DIAGNOSIS — G4733 Obstructive sleep apnea (adult) (pediatric): Secondary | ICD-10-CM | POA: Diagnosis not present

## 2022-04-04 DIAGNOSIS — G4733 Obstructive sleep apnea (adult) (pediatric): Secondary | ICD-10-CM | POA: Diagnosis not present

## 2022-04-16 ENCOUNTER — Encounter: Payer: Self-pay | Admitting: Internal Medicine

## 2022-04-16 ENCOUNTER — Other Ambulatory Visit: Payer: Self-pay | Admitting: Internal Medicine

## 2022-04-16 DIAGNOSIS — R911 Solitary pulmonary nodule: Secondary | ICD-10-CM

## 2022-04-24 DIAGNOSIS — D1801 Hemangioma of skin and subcutaneous tissue: Secondary | ICD-10-CM | POA: Diagnosis not present

## 2022-04-24 DIAGNOSIS — D1722 Benign lipomatous neoplasm of skin and subcutaneous tissue of left arm: Secondary | ICD-10-CM | POA: Diagnosis not present

## 2022-04-24 DIAGNOSIS — L728 Other follicular cysts of the skin and subcutaneous tissue: Secondary | ICD-10-CM | POA: Diagnosis not present

## 2022-04-24 DIAGNOSIS — L821 Other seborrheic keratosis: Secondary | ICD-10-CM | POA: Diagnosis not present

## 2022-04-24 DIAGNOSIS — L814 Other melanin hyperpigmentation: Secondary | ICD-10-CM | POA: Diagnosis not present

## 2022-05-01 DIAGNOSIS — G4733 Obstructive sleep apnea (adult) (pediatric): Secondary | ICD-10-CM | POA: Diagnosis not present

## 2022-05-03 DIAGNOSIS — G4733 Obstructive sleep apnea (adult) (pediatric): Secondary | ICD-10-CM | POA: Diagnosis not present

## 2022-05-16 ENCOUNTER — Ambulatory Visit
Admission: RE | Admit: 2022-05-16 | Discharge: 2022-05-16 | Disposition: A | Discharge status: Home / Self Care | Payer: PPO | Source: Ambulatory Visit | Attending: Internal Medicine | Admitting: Internal Medicine

## 2022-05-16 DIAGNOSIS — R911 Solitary pulmonary nodule: Secondary | ICD-10-CM

## 2022-05-16 DIAGNOSIS — J439 Emphysema, unspecified: Secondary | ICD-10-CM | POA: Diagnosis not present

## 2022-05-16 DIAGNOSIS — I700 Atherosclerosis of aorta: Secondary | ICD-10-CM | POA: Diagnosis not present

## 2022-05-16 DIAGNOSIS — R918 Other nonspecific abnormal finding of lung field: Secondary | ICD-10-CM | POA: Diagnosis not present

## 2022-05-31 DIAGNOSIS — G4733 Obstructive sleep apnea (adult) (pediatric): Secondary | ICD-10-CM | POA: Diagnosis not present

## 2022-06-03 DIAGNOSIS — G4733 Obstructive sleep apnea (adult) (pediatric): Secondary | ICD-10-CM | POA: Diagnosis not present

## 2022-06-20 DIAGNOSIS — E785 Hyperlipidemia, unspecified: Secondary | ICD-10-CM | POA: Diagnosis not present

## 2022-06-20 DIAGNOSIS — D649 Anemia, unspecified: Secondary | ICD-10-CM | POA: Diagnosis not present

## 2022-06-20 DIAGNOSIS — E039 Hypothyroidism, unspecified: Secondary | ICD-10-CM | POA: Diagnosis not present

## 2022-07-01 DIAGNOSIS — G4733 Obstructive sleep apnea (adult) (pediatric): Secondary | ICD-10-CM | POA: Diagnosis not present

## 2022-07-03 DIAGNOSIS — G4733 Obstructive sleep apnea (adult) (pediatric): Secondary | ICD-10-CM | POA: Diagnosis not present

## 2022-07-08 ENCOUNTER — Other Ambulatory Visit: Payer: Self-pay | Admitting: Obstetrics & Gynecology

## 2022-07-08 DIAGNOSIS — Z1231 Encounter for screening mammogram for malignant neoplasm of breast: Secondary | ICD-10-CM

## 2022-07-31 DIAGNOSIS — G4733 Obstructive sleep apnea (adult) (pediatric): Secondary | ICD-10-CM | POA: Diagnosis not present

## 2022-08-20 DIAGNOSIS — H2512 Age-related nuclear cataract, left eye: Secondary | ICD-10-CM | POA: Diagnosis not present

## 2022-08-20 DIAGNOSIS — H16223 Keratoconjunctivitis sicca, not specified as Sjogren's, bilateral: Secondary | ICD-10-CM | POA: Diagnosis not present

## 2022-08-20 DIAGNOSIS — H5213 Myopia, bilateral: Secondary | ICD-10-CM | POA: Diagnosis not present

## 2022-08-20 DIAGNOSIS — H11153 Pinguecula, bilateral: Secondary | ICD-10-CM | POA: Diagnosis not present

## 2022-08-20 DIAGNOSIS — H43813 Vitreous degeneration, bilateral: Secondary | ICD-10-CM | POA: Diagnosis not present

## 2022-08-20 DIAGNOSIS — H524 Presbyopia: Secondary | ICD-10-CM | POA: Diagnosis not present

## 2022-08-20 DIAGNOSIS — H35373 Puckering of macula, bilateral: Secondary | ICD-10-CM | POA: Diagnosis not present

## 2022-08-20 DIAGNOSIS — H52223 Regular astigmatism, bilateral: Secondary | ICD-10-CM | POA: Diagnosis not present

## 2022-08-22 ENCOUNTER — Ambulatory Visit
Admission: RE | Admit: 2022-08-22 | Discharge: 2022-08-22 | Disposition: A | Discharge status: Home / Self Care | Payer: PPO | Source: Ambulatory Visit | Attending: Obstetrics & Gynecology | Admitting: Obstetrics & Gynecology

## 2022-08-22 DIAGNOSIS — Z1231 Encounter for screening mammogram for malignant neoplasm of breast: Secondary | ICD-10-CM

## 2022-08-30 DIAGNOSIS — G4733 Obstructive sleep apnea (adult) (pediatric): Secondary | ICD-10-CM | POA: Diagnosis not present

## 2022-09-08 ENCOUNTER — Emergency Department (HOSPITAL_BASED_OUTPATIENT_CLINIC_OR_DEPARTMENT_OTHER)
Admission: EM | Admit: 2022-09-08 | Discharge: 2022-09-08 | Disposition: A | Discharge status: Home / Self Care | Payer: HMO | Attending: Emergency Medicine | Admitting: Emergency Medicine

## 2022-09-08 ENCOUNTER — Other Ambulatory Visit: Payer: Self-pay

## 2022-09-08 ENCOUNTER — Encounter (HOSPITAL_BASED_OUTPATIENT_CLINIC_OR_DEPARTMENT_OTHER): Payer: Self-pay

## 2022-09-08 DIAGNOSIS — H1132 Conjunctival hemorrhage, left eye: Secondary | ICD-10-CM | POA: Insufficient documentation

## 2022-09-08 DIAGNOSIS — E039 Hypothyroidism, unspecified: Secondary | ICD-10-CM | POA: Diagnosis not present

## 2022-09-08 DIAGNOSIS — Z7982 Long term (current) use of aspirin: Secondary | ICD-10-CM | POA: Diagnosis not present

## 2022-09-08 DIAGNOSIS — H5789 Other specified disorders of eye and adnexa: Secondary | ICD-10-CM | POA: Diagnosis present

## 2022-09-08 NOTE — ED Triage Notes (Addendum)
Patient here POV from Home.  Endorses Left Eye Sclera becoming Red and Bulging. Began 20 Minutes ago. No Changes in Vision. No Visual Aids. No Trauma or Injury noted. No Pain.  NAD Noted during Triage. A&Ox4. GCS 15. Ambulatory.

## 2022-09-08 NOTE — ED Provider Notes (Signed)
[place] EMERGENCY DEPARTMENT AT [facility] Provider Note   CSN: [id number] Arrival date & time: 09/08/22  1914     History  Chief Complaint  Patient presents with   Eye Problem    Diana Armstrong is a 67 y.o. female.  The history is provided by the patient and medical records. No language interpreter was used.  Eye Problem Location:  Left eye Quality:  Unable to specify Duration:  1 hour Timing:  Constant Progression:  Unchanged Chronicity:  New Context comment:  Redness Relieved by:  Nothing Worsened by:  Nothing Ineffective treatments:  None tried Associated symptoms: redness   Associated symptoms: no blurred vision, no decreased vision, no discharge, no double vision, no facial rash, no headaches, no inflammation, no itching, no nausea, no numbness, no photophobia, no tearing, no vomiting and no weakness   Associated symptoms comment:  Redness      Home Medications Prior to Admission medications   Medication Sig Start Date End Date Taking? Authorizing Provider  aspirin 81 MG tablet Take 81 mg by mouth every other day.    [provider]  Calcium-Magnesium-Vitamin D (CALCIUM 1200+D3 PO) Take by mouth daily. Pt takes Calicum 1200 mg + 1000 mg D3 every day    [provider]  Cholecalciferol (VITAMIN D3) 1000 UNITS CAPS Take 4,000 Int'l Units by mouth daily.    [provider]  Coenzyme Q10 (COQ10) 200 MG CAPS Take by mouth daily.    [provider]  COLLAGEN PO Take by mouth.    [provider]  esomeprazole (NEXIUM) 40 MG capsule Take 40 mg by mouth daily before breakfast.    [provider]  fluorouracil (EFUDEX) 5 % cream Apply to affected area qhs Monday - Sunday x 2 weeks. 06/05/21   Sheffield, Judye Bos, PA-C  nitroGLYCERIN (NITRODUR - DOSED IN MG/24 HR) 0.2 mg/hr patch 0.2 mg daily. 10/04/20   [provider]  omega-3 acid ethyl esters (LOVAZA) 1 g capsule Take 4 capsules by mouth daily.  07/14/19   [provider]  RESTASIS 0.05 % ophthalmic emulsion 1 drop 2 (two) times daily. 08/03/19   [provider]  rosuvastatin (CRESTOR) 10 MG tablet  07/10/18   [provider]  SYNTHROID 112 MCG tablet  07/27/18   [provider]  tretinoin (RETIN-A) 0.1 % cream Apply topically as needed.  03/04/14   [provider]  TURMERIC PO Take by mouth daily.    [provider]  UNABLE TO FIND Med Name: fiber therapy tablet    [provider]  zolpidem (AMBIEN) 5 MG tablet Take 5 mg by mouth as needed.  02/04/14   [provider]      Allergies    Patient has no known allergies.    Review of Systems   Review of Systems  Constitutional:  Negative for chills, fatigue and fever.  HENT:  Negative for congestion.   Eyes:  Positive for redness. Negative for blurred vision, double vision, photophobia, discharge, itching and visual disturbance.  Respiratory:  Negative for cough and chest tightness.   Cardiovascular:  Negative for chest pain.  Gastrointestinal:  Negative for abdominal pain, nausea and vomiting.  Genitourinary:  Negative for dysuria.  Musculoskeletal:  Negative for back pain.  Skin:  Negative for rash.  Neurological:  Negative for weakness, numbness and headaches.  All other systems reviewed and are negative.   Physical Exam Updated Vital Signs BP (!) 172/93   Pulse 62  Temp (!) 96.3 F (35.7 C) (Temporal)   Resp 18   Ht 5\' 4"  (1.626 m)   Wt 70.3 kg   LMP 05/26/2004   SpO2 97%   BMI 26.61 kg/m  Physical Exam Vitals and nursing note reviewed.  Constitutional:      General: She is not in acute distress.    Appearance: She is well-developed. She is not ill-appearing, toxic-appearing or diaphoretic.  HENT:     Head: Atraumatic.     Nose: No congestion or rhinorrhea.     Mouth/Throat:     Mouth: Mucous membranes are moist.     Pharynx: No oropharyngeal exudate or posterior oropharyngeal erythema.   Eyes:     General: Lids are normal. Vision grossly intact. No scleral icterus.    Extraocular Movements: Extraocular movements intact.     Right eye: Normal extraocular motion and no nystagmus.     Left eye: Normal extraocular motion and no nystagmus.     Conjunctiva/sclera:     Left eye: Hemorrhage present.     Pupils: Pupils are equal, round, and reactive to light.     Comments: Subconjunctival hematoma/hemorrhage of left eye in the lateral, medial, and inferior sclera.  No bleeding on the superior part of the conjunctiva.  No bleeding covering the iris.  Pupils symmetric and reactive with normal extract movements.  No entrapment.  No tenderness present.  No pain.  No photophobia.  Cardiovascular:     Rate and Rhythm: Normal rate and regular rhythm.     Heart sounds: No murmur heard. Pulmonary:     Effort: Pulmonary effort is normal. No respiratory distress.     Breath sounds: Normal breath sounds.  Abdominal:     Palpations: Abdomen is soft.     Tenderness: There is no abdominal tenderness.  Musculoskeletal:        General: No swelling or tenderness.     Cervical back: Neck supple.  Skin:    General: Skin is warm and dry.     Capillary Refill: Capillary refill takes less than 2 seconds.  Neurological:     General: No focal deficit present.     Mental Status: She is alert.  Psychiatric:        Mood and Affect: Mood normal.     ED Results / Procedures / Treatments   Labs (all labs ordered are listed, but only abnormal results are displayed) Labs Reviewed - No data to display  EKG None  Radiology No results found.  Procedures Procedures    Medications Ordered in ED Medications - No data to display  ED Course/ Medical Decision Making/ A&P                             Medical Decision Making   Diana Armstrong is a 67 y.o. female with a past medical history significant for hyperlipidemia, kidney stones, GERD, and hypothyroidism who presents for eye problem.   According to patient, for about the last hour she noticed a blood clot and hematoma around her left eye.  She reports no trauma and denies nausea or vomiting.  She denies any pain or vision changes.  Denies any scratches or scrapes to her eye has not gotten anything in it.  She reports that she was drinking getting ready to watch a soccer game when she noticed this.  She reports she has never had this before.  She does not take blood thinners.  Blood pressure was elevated on arrival but it has improved while in the waiting room.  She denies any straining or coughing fits.  No facial pain and no facial rashes otherwise.  She is feeling at her baseline otherwise.  On exam, lungs clear.  Chest nontender.  Abdomen nontender.  No bleeding in the gums.  Patient has what appears to be some conjunctival hematoma/hemorrhage in her left eye in the lateral, medial, and inferior parts of the conjunctiva.  Pupils symmetric and reactive with normal extraocular movements.  No entrapment.  No tenderness around the eye.  No erythema.  Patient otherwise resting comfortably.  She can close her eye without difficulty.  Patient well-appearing.  We discussed that this very clearly appears to be a subconjunctival hematoma.  We discussed that it will reabsorb in the next few weeks and given her lack of any vision changes, pain, or evidence of infection or known trauma, I have low suspicion for more concerning etiology.  We agreed that given her description of symptoms and exam do not feel she needs more extensive eye exam imaging or labs and patient agrees.  No other bleeding problems.  Patient agrees with plan of care and was discharged in good condition for outpatient follow-up.           Final Clinical Impression(s) / ED Diagnoses Final diagnoses:  Subconjunctival hemorrhage of left eye    Rx / DC Orders ED Discharge Orders     None      Clinical Impression: 1. Subconjunctival hemorrhage of left eye      Disposition: Discharge  Condition: Good  I have discussed the results, Dx and Tx plan with the pt(& family if present). He/she/they expressed understanding and agree(s) with the plan. Discharge instructions discussed at great length. Strict return precautions discussed and pt &/or family have verbalized understanding of the instructions. No further questions at time of discharge.    Discharge Medication List as of 09/08/2022  8:52 PM      Follow Up: Cleatis Polka., MD [address] [place] [address] [phone]     [facility] Emergency Department at [facility] [address] [place] [address] [phone]       Rosaria Kubin, Canary Brim, MD 09/08/22 [phone]

## 2022-09-08 NOTE — Discharge Instructions (Addendum)
Your history and exam today are consistent with a subconjunctival hematoma or hemorrhage that started this evening.  Your exam is classic for this type of benign bleeding.  Given your lack of any vision changes, significant pain, and no preceding trauma, we have low suspicion for concerning injury at this time.  Your blood pressure was elevated on arrival but since has improved.  This may have contributed but I do suspect your bleeding was spontaneous.  Given your lack of other symptoms we feel you are safe for discharge home without extensive workup tonight however if you develop any changing or worsening symptoms such as true vision changes, headache, or bleeding in other places, please return to the nearest emergency department.

## 2022-09-20 DIAGNOSIS — E039 Hypothyroidism, unspecified: Secondary | ICD-10-CM | POA: Diagnosis not present

## 2022-09-20 DIAGNOSIS — E785 Hyperlipidemia, unspecified: Secondary | ICD-10-CM | POA: Diagnosis not present

## 2022-09-20 DIAGNOSIS — D649 Anemia, unspecified: Secondary | ICD-10-CM | POA: Diagnosis not present

## 2022-09-23 DIAGNOSIS — L570 Actinic keratosis: Secondary | ICD-10-CM | POA: Diagnosis not present

## 2022-09-23 DIAGNOSIS — L820 Inflamed seborrheic keratosis: Secondary | ICD-10-CM | POA: Diagnosis not present

## 2022-09-23 DIAGNOSIS — L821 Other seborrheic keratosis: Secondary | ICD-10-CM | POA: Diagnosis not present

## 2022-09-23 DIAGNOSIS — L578 Other skin changes due to chronic exposure to nonionizing radiation: Secondary | ICD-10-CM | POA: Diagnosis not present

## 2022-09-23 DIAGNOSIS — L814 Other melanin hyperpigmentation: Secondary | ICD-10-CM | POA: Diagnosis not present

## 2022-09-30 DIAGNOSIS — G4733 Obstructive sleep apnea (adult) (pediatric): Secondary | ICD-10-CM | POA: Diagnosis not present

## 2022-10-02 ENCOUNTER — Encounter (HOSPITAL_BASED_OUTPATIENT_CLINIC_OR_DEPARTMENT_OTHER): Payer: Self-pay | Admitting: Medical

## 2022-10-02 ENCOUNTER — Ambulatory Visit (INDEPENDENT_AMBULATORY_CARE_PROVIDER_SITE_OTHER): Payer: HMO | Admitting: Medical

## 2022-10-02 ENCOUNTER — Other Ambulatory Visit (HOSPITAL_COMMUNITY)
Admission: RE | Admit: 2022-10-02 | Discharge: 2022-10-02 | Disposition: A | Discharge status: Home / Self Care | Payer: HMO | Source: Ambulatory Visit | Attending: Medical | Admitting: Medical

## 2022-10-02 VITALS — BP 145/75 | HR 61 | Ht 64.0 in | Wt 166.2 lb

## 2022-10-02 DIAGNOSIS — Z01419 Encounter for gynecological examination (general) (routine) without abnormal findings: Secondary | ICD-10-CM | POA: Insufficient documentation

## 2022-10-02 DIAGNOSIS — Z1151 Encounter for screening for human papillomavirus (HPV): Secondary | ICD-10-CM | POA: Insufficient documentation

## 2022-10-02 DIAGNOSIS — Z124 Encounter for screening for malignant neoplasm of cervix: Secondary | ICD-10-CM

## 2022-10-02 NOTE — Progress Notes (Signed)
History:  Ms. Diana Armstrong is a 67 y.o. [id number] who presents to clinic today for annual exam. Last pap smear was 07/2019 and normal. Patient is not currently sexually active She is post-menopausal. She denies vaginal bleeding, discharge, UTI symptoms, GI issues or breast concerns today. She has no GYN concerns.   The following portions of the patient's history were reviewed and updated as appropriate: allergies, current medications, family history, past medical history, social history, past surgical history and problem list.  Review of Systems:  Review of Systems  Constitutional:  Negative for fever and malaise/fatigue.  Gastrointestinal:  Negative for abdominal pain, constipation, diarrhea, nausea and vomiting.  Genitourinary:  Negative for dysuria, frequency and urgency.       Neg - vaginal bleeding, discharge, pelvic pain      Objective:  Physical Exam BP (!) 145/75 (BP Location: Left Arm, Patient Position: Sitting, Cuff Size: Large)   Pulse 61   Ht 5\' 4"  (1.626 m) Comment: Reported  Wt 166 lb 3.2 oz (75.4 kg)   LMP 05/26/2004   BMI 28.53 kg/m  Physical Exam Vitals and nursing note reviewed. Exam conducted with a chaperone present.  Constitutional:      General: She is not in acute distress.    Appearance: Normal appearance. She is well-developed and normal weight.  HENT:     Head: Normocephalic and atraumatic.  Neck:     Thyroid: No thyromegaly.  Cardiovascular:     Rate and Rhythm: Normal rate and regular rhythm.     Heart sounds: No murmur heard. Pulmonary:     Effort: Pulmonary effort is normal. No respiratory distress.     Breath sounds: Normal breath sounds. No wheezing.  Abdominal:     General: Abdomen is flat. Bowel sounds are normal. There is no distension.     Palpations: Abdomen is soft. There is no mass.     Tenderness: There is no abdominal tenderness. There is no guarding or rebound.  Genitourinary:    General: Normal vulva.     Vagina: No vaginal  discharge, erythema, tenderness or bleeding.     Cervix: No cervical motion tenderness, discharge, friability, lesion, erythema or cervical bleeding.     Uterus: Not enlarged and not tender.      Adnexa:        Right: No mass or tenderness.         Left: No mass or tenderness.    Musculoskeletal:     Cervical back: Neck supple.  Skin:    General: Skin is warm and dry.     Findings: No erythema.  Neurological:     Mental Status: She is alert and oriented to person, place, and time.  Psychiatric:        Mood and Affect: Mood normal.      Health Maintenance Due  Topic Date Due   Pneumonia Vaccine 39+ Years old (1 of 2 - PCV) Never done   Hepatitis C Screening  Never done   Zoster Vaccines- Shingrix (1 of 2) Never done   DEXA SCAN  Never done   COVID-19 Vaccine (3 - Pfizer risk series) 05/07/2019   Medicare Annual Wellness (AWV)  09/25/2022   INFLUENZA VACCINE  09/26/2022    Labs, imaging and previous visits in Epic and Care Everywhere reviewed  Assessment & Plan:  1. Screening for cervical cancer - Cytology - PAP( [facility]) - Patient advised that due to normal pap history, if this pap smear is normal, she  will not need further pap smears  - Patient is welcome to return for annual exams if she desires  - patient should continue yearly mammograms   Return in 1 year (on 10/02/2023) for Annual exam.  Marny Lowenstein, PA-C 10/03/2022 2:45 PM

## 2022-10-07 ENCOUNTER — Encounter (HOSPITAL_BASED_OUTPATIENT_CLINIC_OR_DEPARTMENT_OTHER): Payer: PPO | Admitting: Obstetrics & Gynecology

## 2022-10-31 DIAGNOSIS — G4733 Obstructive sleep apnea (adult) (pediatric): Secondary | ICD-10-CM | POA: Diagnosis not present

## 2022-12-02 DIAGNOSIS — G4733 Obstructive sleep apnea (adult) (pediatric): Secondary | ICD-10-CM | POA: Diagnosis not present

## 2022-12-03 DIAGNOSIS — E039 Hypothyroidism, unspecified: Secondary | ICD-10-CM | POA: Diagnosis not present

## 2022-12-03 DIAGNOSIS — R7301 Impaired fasting glucose: Secondary | ICD-10-CM | POA: Diagnosis not present

## 2022-12-03 DIAGNOSIS — M810 Age-related osteoporosis without current pathological fracture: Secondary | ICD-10-CM | POA: Diagnosis not present

## 2022-12-03 DIAGNOSIS — Z1389 Encounter for screening for other disorder: Secondary | ICD-10-CM | POA: Diagnosis not present

## 2022-12-03 DIAGNOSIS — E785 Hyperlipidemia, unspecified: Secondary | ICD-10-CM | POA: Diagnosis not present

## 2022-12-10 DIAGNOSIS — E785 Hyperlipidemia, unspecified: Secondary | ICD-10-CM | POA: Diagnosis not present

## 2022-12-10 DIAGNOSIS — M810 Age-related osteoporosis without current pathological fracture: Secondary | ICD-10-CM | POA: Diagnosis not present

## 2022-12-10 DIAGNOSIS — E611 Iron deficiency: Secondary | ICD-10-CM | POA: Diagnosis not present

## 2022-12-10 DIAGNOSIS — G5710 Meralgia paresthetica, unspecified lower limb: Secondary | ICD-10-CM | POA: Diagnosis not present

## 2022-12-10 DIAGNOSIS — F17200 Nicotine dependence, unspecified, uncomplicated: Secondary | ICD-10-CM | POA: Diagnosis not present

## 2022-12-10 DIAGNOSIS — Z1331 Encounter for screening for depression: Secondary | ICD-10-CM | POA: Diagnosis not present

## 2022-12-10 DIAGNOSIS — I2510 Atherosclerotic heart disease of native coronary artery without angina pectoris: Secondary | ICD-10-CM | POA: Diagnosis not present

## 2022-12-10 DIAGNOSIS — I700 Atherosclerosis of aorta: Secondary | ICD-10-CM | POA: Diagnosis not present

## 2022-12-10 DIAGNOSIS — Z0000 Encounter for general adult medical examination without abnormal findings: Secondary | ICD-10-CM | POA: Diagnosis not present

## 2022-12-10 DIAGNOSIS — J439 Emphysema, unspecified: Secondary | ICD-10-CM | POA: Diagnosis not present

## 2022-12-10 DIAGNOSIS — R82998 Other abnormal findings in urine: Secondary | ICD-10-CM | POA: Diagnosis not present

## 2022-12-10 DIAGNOSIS — Z8669 Personal history of other diseases of the nervous system and sense organs: Secondary | ICD-10-CM | POA: Diagnosis not present

## 2022-12-10 DIAGNOSIS — Z1339 Encounter for screening examination for other mental health and behavioral disorders: Secondary | ICD-10-CM | POA: Diagnosis not present

## 2022-12-10 DIAGNOSIS — Z860101 Personal history of adenomatous and serrated colon polyps: Secondary | ICD-10-CM | POA: Diagnosis not present

## 2023-01-01 DIAGNOSIS — G4733 Obstructive sleep apnea (adult) (pediatric): Secondary | ICD-10-CM | POA: Diagnosis not present

## 2023-01-31 DIAGNOSIS — G4733 Obstructive sleep apnea (adult) (pediatric): Secondary | ICD-10-CM | POA: Diagnosis not present

## 2023-03-03 DIAGNOSIS — H1013 Acute atopic conjunctivitis, bilateral: Secondary | ICD-10-CM | POA: Diagnosis not present

## 2023-03-04 DIAGNOSIS — G4733 Obstructive sleep apnea (adult) (pediatric): Secondary | ICD-10-CM | POA: Diagnosis not present

## 2023-03-05 DIAGNOSIS — R7989 Other specified abnormal findings of blood chemistry: Secondary | ICD-10-CM | POA: Diagnosis not present

## 2023-03-05 DIAGNOSIS — E039 Hypothyroidism, unspecified: Secondary | ICD-10-CM | POA: Diagnosis not present

## 2023-03-05 DIAGNOSIS — E611 Iron deficiency: Secondary | ICD-10-CM | POA: Diagnosis not present

## 2023-03-05 DIAGNOSIS — E785 Hyperlipidemia, unspecified: Secondary | ICD-10-CM | POA: Diagnosis not present

## 2023-04-01 NOTE — Progress Notes (Signed)
PUO

## 2023-04-03 DIAGNOSIS — G4733 Obstructive sleep apnea (adult) (pediatric): Secondary | ICD-10-CM | POA: Diagnosis not present

## 2023-04-15 DIAGNOSIS — L570 Actinic keratosis: Secondary | ICD-10-CM | POA: Diagnosis not present

## 2023-05-05 DIAGNOSIS — G4733 Obstructive sleep apnea (adult) (pediatric): Secondary | ICD-10-CM | POA: Diagnosis not present

## 2023-05-16 ENCOUNTER — Other Ambulatory Visit: Payer: Self-pay | Admitting: Internal Medicine

## 2023-05-16 DIAGNOSIS — F17200 Nicotine dependence, unspecified, uncomplicated: Secondary | ICD-10-CM

## 2023-05-27 ENCOUNTER — Ambulatory Visit
Admission: RE | Admit: 2023-05-27 | Discharge: 2023-05-27 | Disposition: A | Discharge status: Home / Self Care | Source: Ambulatory Visit | Attending: Internal Medicine | Admitting: Internal Medicine

## 2023-05-27 DIAGNOSIS — F17200 Nicotine dependence, unspecified, uncomplicated: Secondary | ICD-10-CM

## 2023-05-27 DIAGNOSIS — F17210 Nicotine dependence, cigarettes, uncomplicated: Secondary | ICD-10-CM | POA: Diagnosis not present

## 2023-05-27 DIAGNOSIS — Z122 Encounter for screening for malignant neoplasm of respiratory organs: Secondary | ICD-10-CM | POA: Diagnosis not present

## 2023-06-02 DIAGNOSIS — E039 Hypothyroidism, unspecified: Secondary | ICD-10-CM | POA: Diagnosis not present

## 2023-06-04 DIAGNOSIS — G4733 Obstructive sleep apnea (adult) (pediatric): Secondary | ICD-10-CM | POA: Diagnosis not present

## 2023-06-10 DIAGNOSIS — L570 Actinic keratosis: Secondary | ICD-10-CM | POA: Diagnosis not present

## 2023-06-10 DIAGNOSIS — L811 Chloasma: Secondary | ICD-10-CM | POA: Diagnosis not present

## 2023-07-02 DIAGNOSIS — R509 Fever, unspecified: Secondary | ICD-10-CM | POA: Diagnosis not present

## 2023-07-02 DIAGNOSIS — Z20822 Contact with and (suspected) exposure to covid-19: Secondary | ICD-10-CM | POA: Diagnosis not present

## 2023-07-03 DIAGNOSIS — R058 Other specified cough: Secondary | ICD-10-CM | POA: Diagnosis not present

## 2023-07-03 DIAGNOSIS — J189 Pneumonia, unspecified organism: Secondary | ICD-10-CM | POA: Diagnosis not present

## 2023-07-03 DIAGNOSIS — R509 Fever, unspecified: Secondary | ICD-10-CM | POA: Diagnosis not present

## 2023-07-03 DIAGNOSIS — Z1152 Encounter for screening for COVID-19: Secondary | ICD-10-CM | POA: Diagnosis not present

## 2023-07-03 DIAGNOSIS — R5383 Other fatigue: Secondary | ICD-10-CM | POA: Diagnosis not present

## 2023-07-03 DIAGNOSIS — R0981 Nasal congestion: Secondary | ICD-10-CM | POA: Diagnosis not present

## 2023-07-03 DIAGNOSIS — R197 Diarrhea, unspecified: Secondary | ICD-10-CM | POA: Diagnosis not present

## 2023-07-03 DIAGNOSIS — R112 Nausea with vomiting, unspecified: Secondary | ICD-10-CM | POA: Diagnosis not present

## 2023-07-03 DIAGNOSIS — R531 Weakness: Secondary | ICD-10-CM | POA: Diagnosis not present

## 2023-07-04 DIAGNOSIS — G4733 Obstructive sleep apnea (adult) (pediatric): Secondary | ICD-10-CM | POA: Diagnosis not present

## 2023-07-25 DIAGNOSIS — M25562 Pain in left knee: Secondary | ICD-10-CM | POA: Diagnosis not present

## 2023-07-28 ENCOUNTER — Other Ambulatory Visit: Payer: Self-pay | Admitting: Obstetrics & Gynecology

## 2023-07-28 DIAGNOSIS — Z1231 Encounter for screening mammogram for malignant neoplasm of breast: Secondary | ICD-10-CM

## 2023-07-30 DIAGNOSIS — M7052 Other bursitis of knee, left knee: Secondary | ICD-10-CM | POA: Diagnosis not present

## 2023-08-04 DIAGNOSIS — L718 Other rosacea: Secondary | ICD-10-CM | POA: Diagnosis not present

## 2023-08-04 DIAGNOSIS — L720 Epidermal cyst: Secondary | ICD-10-CM | POA: Diagnosis not present

## 2023-08-04 DIAGNOSIS — L821 Other seborrheic keratosis: Secondary | ICD-10-CM | POA: Diagnosis not present

## 2023-08-04 DIAGNOSIS — L811 Chloasma: Secondary | ICD-10-CM | POA: Diagnosis not present

## 2023-08-04 DIAGNOSIS — Z129 Encounter for screening for malignant neoplasm, site unspecified: Secondary | ICD-10-CM | POA: Diagnosis not present

## 2023-08-04 DIAGNOSIS — G4733 Obstructive sleep apnea (adult) (pediatric): Secondary | ICD-10-CM | POA: Diagnosis not present

## 2023-08-04 DIAGNOSIS — L820 Inflamed seborrheic keratosis: Secondary | ICD-10-CM | POA: Diagnosis not present

## 2023-08-04 DIAGNOSIS — L570 Actinic keratosis: Secondary | ICD-10-CM | POA: Diagnosis not present

## 2023-08-04 DIAGNOSIS — D2271 Melanocytic nevi of right lower limb, including hip: Secondary | ICD-10-CM | POA: Diagnosis not present

## 2023-08-04 DIAGNOSIS — D2272 Melanocytic nevi of left lower limb, including hip: Secondary | ICD-10-CM | POA: Diagnosis not present

## 2023-08-20 DIAGNOSIS — H16223 Keratoconjunctivitis sicca, not specified as Sjogren's, bilateral: Secondary | ICD-10-CM | POA: Diagnosis not present

## 2023-08-20 DIAGNOSIS — H11153 Pinguecula, bilateral: Secondary | ICD-10-CM | POA: Diagnosis not present

## 2023-08-20 DIAGNOSIS — H5213 Myopia, bilateral: Secondary | ICD-10-CM | POA: Diagnosis not present

## 2023-08-20 DIAGNOSIS — H35373 Puckering of macula, bilateral: Secondary | ICD-10-CM | POA: Diagnosis not present

## 2023-08-20 DIAGNOSIS — H524 Presbyopia: Secondary | ICD-10-CM | POA: Diagnosis not present

## 2023-08-20 DIAGNOSIS — M25562 Pain in left knee: Secondary | ICD-10-CM | POA: Diagnosis not present

## 2023-08-20 DIAGNOSIS — H43813 Vitreous degeneration, bilateral: Secondary | ICD-10-CM | POA: Diagnosis not present

## 2023-08-25 ENCOUNTER — Ambulatory Visit
Admission: RE | Admit: 2023-08-25 | Discharge: 2023-08-25 | Disposition: A | Discharge status: Home / Self Care | Source: Ambulatory Visit | Attending: Obstetrics & Gynecology | Admitting: Obstetrics & Gynecology

## 2023-08-25 DIAGNOSIS — Z1231 Encounter for screening mammogram for malignant neoplasm of breast: Secondary | ICD-10-CM | POA: Diagnosis not present

## 2023-09-02 DIAGNOSIS — Z1389 Encounter for screening for other disorder: Secondary | ICD-10-CM | POA: Diagnosis not present

## 2023-09-03 DIAGNOSIS — G4733 Obstructive sleep apnea (adult) (pediatric): Secondary | ICD-10-CM | POA: Diagnosis not present

## 2023-09-22 DIAGNOSIS — M1712 Unilateral primary osteoarthritis, left knee: Secondary | ICD-10-CM | POA: Diagnosis not present

## 2023-09-22 DIAGNOSIS — M25561 Pain in right knee: Secondary | ICD-10-CM | POA: Diagnosis not present

## 2023-09-30 DIAGNOSIS — M1712 Unilateral primary osteoarthritis, left knee: Secondary | ICD-10-CM | POA: Diagnosis not present

## 2023-10-03 DIAGNOSIS — G4733 Obstructive sleep apnea (adult) (pediatric): Secondary | ICD-10-CM | POA: Diagnosis not present

## 2023-10-08 DIAGNOSIS — M1712 Unilateral primary osteoarthritis, left knee: Secondary | ICD-10-CM | POA: Diagnosis not present

## 2023-10-23 DIAGNOSIS — M1711 Unilateral primary osteoarthritis, right knee: Secondary | ICD-10-CM | POA: Diagnosis not present

## 2023-10-31 DIAGNOSIS — M1711 Unilateral primary osteoarthritis, right knee: Secondary | ICD-10-CM | POA: Diagnosis not present

## 2023-11-03 DIAGNOSIS — G4733 Obstructive sleep apnea (adult) (pediatric): Secondary | ICD-10-CM | POA: Diagnosis not present

## 2023-11-06 DIAGNOSIS — M1711 Unilateral primary osteoarthritis, right knee: Secondary | ICD-10-CM | POA: Diagnosis not present

## 2023-12-03 DIAGNOSIS — G4733 Obstructive sleep apnea (adult) (pediatric): Secondary | ICD-10-CM | POA: Diagnosis not present

## 2023-12-30 DIAGNOSIS — M810 Age-related osteoporosis without current pathological fracture: Secondary | ICD-10-CM | POA: Diagnosis not present

## 2023-12-30 DIAGNOSIS — E039 Hypothyroidism, unspecified: Secondary | ICD-10-CM | POA: Diagnosis not present

## 2023-12-30 DIAGNOSIS — E785 Hyperlipidemia, unspecified: Secondary | ICD-10-CM | POA: Diagnosis not present

## 2023-12-30 DIAGNOSIS — R7301 Impaired fasting glucose: Secondary | ICD-10-CM | POA: Diagnosis not present

## 2023-12-30 DIAGNOSIS — R030 Elevated blood-pressure reading, without diagnosis of hypertension: Secondary | ICD-10-CM | POA: Diagnosis not present

## 2024-01-06 DIAGNOSIS — M810 Age-related osteoporosis without current pathological fracture: Secondary | ICD-10-CM | POA: Diagnosis not present

## 2024-01-06 DIAGNOSIS — G4733 Obstructive sleep apnea (adult) (pediatric): Secondary | ICD-10-CM | POA: Diagnosis not present

## 2024-01-06 DIAGNOSIS — D509 Iron deficiency anemia, unspecified: Secondary | ICD-10-CM | POA: Diagnosis not present

## 2024-01-06 DIAGNOSIS — Z1339 Encounter for screening examination for other mental health and behavioral disorders: Secondary | ICD-10-CM | POA: Diagnosis not present

## 2024-01-06 DIAGNOSIS — Z0000 Encounter for general adult medical examination without abnormal findings: Secondary | ICD-10-CM | POA: Diagnosis not present

## 2024-01-06 DIAGNOSIS — I2510 Atherosclerotic heart disease of native coronary artery without angina pectoris: Secondary | ICD-10-CM | POA: Diagnosis not present

## 2024-01-06 DIAGNOSIS — Z1331 Encounter for screening for depression: Secondary | ICD-10-CM | POA: Diagnosis not present

## 2024-01-06 DIAGNOSIS — R030 Elevated blood-pressure reading, without diagnosis of hypertension: Secondary | ICD-10-CM | POA: Diagnosis not present

## 2024-01-06 DIAGNOSIS — E785 Hyperlipidemia, unspecified: Secondary | ICD-10-CM | POA: Diagnosis not present

## 2024-01-06 DIAGNOSIS — F17200 Nicotine dependence, unspecified, uncomplicated: Secondary | ICD-10-CM | POA: Diagnosis not present

## 2024-01-06 DIAGNOSIS — J439 Emphysema, unspecified: Secondary | ICD-10-CM | POA: Diagnosis not present

## 2024-01-06 DIAGNOSIS — R82998 Other abnormal findings in urine: Secondary | ICD-10-CM | POA: Diagnosis not present

## 2024-01-06 DIAGNOSIS — Z8701 Personal history of pneumonia (recurrent): Secondary | ICD-10-CM | POA: Diagnosis not present

## 2024-01-06 DIAGNOSIS — E039 Hypothyroidism, unspecified: Secondary | ICD-10-CM | POA: Diagnosis not present

## 2024-01-20 DIAGNOSIS — L03114 Cellulitis of left upper limb: Secondary | ICD-10-CM | POA: Diagnosis not present
